# Patient Record
Sex: Male | Born: 1970 | Race: Black or African American | Hispanic: No | Marital: Single | Smoking: Former smoker
Health system: Southern US, Community
[De-identification: ages and names within clinical notes are randomized; demographics above are authoritative.]

---

## 1999-01-19 ENCOUNTER — Emergency Department (HOSPITAL_COMMUNITY): Admission: EM | Admit: 1999-01-19 | Discharge: 1999-01-19 | Payer: Self-pay | Admitting: Emergency Medicine

## 2000-12-15 ENCOUNTER — Emergency Department (HOSPITAL_COMMUNITY): Admission: EM | Admit: 2000-12-15 | Discharge: 2000-12-15 | Payer: Self-pay | Admitting: Emergency Medicine

## 2000-12-17 ENCOUNTER — Emergency Department (HOSPITAL_COMMUNITY): Admission: EM | Admit: 2000-12-17 | Discharge: 2000-12-17 | Payer: Self-pay | Admitting: Emergency Medicine

## 2000-12-18 ENCOUNTER — Emergency Department (HOSPITAL_COMMUNITY): Admission: EM | Admit: 2000-12-18 | Discharge: 2000-12-18 | Payer: Self-pay | Admitting: Emergency Medicine

## 2000-12-24 ENCOUNTER — Encounter: Admission: RE | Admit: 2000-12-24 | Discharge: 2000-12-24 | Payer: Self-pay | Admitting: Internal Medicine

## 2000-12-29 ENCOUNTER — Encounter: Admission: RE | Admit: 2000-12-29 | Discharge: 2000-12-29 | Payer: Self-pay | Admitting: Internal Medicine

## 2001-05-21 ENCOUNTER — Encounter: Admission: RE | Admit: 2001-05-21 | Discharge: 2001-05-21 | Payer: Self-pay

## 2001-06-30 ENCOUNTER — Encounter: Admission: RE | Admit: 2001-06-30 | Discharge: 2001-06-30 | Payer: Self-pay | Admitting: Internal Medicine

## 2001-09-10 ENCOUNTER — Encounter: Admission: RE | Admit: 2001-09-10 | Discharge: 2001-09-10 | Payer: Self-pay | Admitting: Internal Medicine

## 2002-02-05 ENCOUNTER — Encounter: Admission: RE | Admit: 2002-02-05 | Discharge: 2002-02-05 | Payer: Self-pay | Admitting: Internal Medicine

## 2002-03-29 ENCOUNTER — Ambulatory Visit (HOSPITAL_COMMUNITY): Admission: RE | Admit: 2002-03-29 | Discharge: 2002-03-29 | Payer: Self-pay | Admitting: Internal Medicine

## 2002-03-29 ENCOUNTER — Encounter: Admission: RE | Admit: 2002-03-29 | Discharge: 2002-03-29 | Payer: Self-pay | Admitting: Internal Medicine

## 2002-03-29 ENCOUNTER — Encounter: Payer: Self-pay | Admitting: Internal Medicine

## 2002-04-19 ENCOUNTER — Encounter: Admission: RE | Admit: 2002-04-19 | Discharge: 2002-04-19 | Payer: Self-pay | Admitting: Internal Medicine

## 2002-05-04 ENCOUNTER — Encounter: Admission: RE | Admit: 2002-05-04 | Discharge: 2002-05-04 | Payer: Self-pay | Admitting: Internal Medicine

## 2002-05-12 ENCOUNTER — Emergency Department (HOSPITAL_COMMUNITY): Admission: EM | Admit: 2002-05-12 | Discharge: 2002-05-12 | Payer: Self-pay | Admitting: *Deleted

## 2002-05-14 ENCOUNTER — Encounter: Admission: RE | Admit: 2002-05-14 | Discharge: 2002-05-14 | Payer: Self-pay | Admitting: Internal Medicine

## 2003-01-08 ENCOUNTER — Emergency Department (HOSPITAL_COMMUNITY): Admission: EM | Admit: 2003-01-08 | Discharge: 2003-01-08 | Payer: Self-pay | Admitting: Emergency Medicine

## 2003-09-30 ENCOUNTER — Encounter: Admission: RE | Admit: 2003-09-30 | Discharge: 2003-09-30 | Payer: Self-pay | Admitting: Internal Medicine

## 2003-12-27 ENCOUNTER — Emergency Department (HOSPITAL_COMMUNITY): Admission: AC | Admit: 2003-12-27 | Discharge: 2003-12-27 | Payer: Self-pay

## 2004-01-02 ENCOUNTER — Emergency Department (HOSPITAL_COMMUNITY): Admission: EM | Admit: 2004-01-02 | Discharge: 2004-01-02 | Payer: Self-pay | Admitting: *Deleted

## 2004-01-14 ENCOUNTER — Emergency Department (HOSPITAL_COMMUNITY): Admission: EM | Admit: 2004-01-14 | Discharge: 2004-01-14 | Payer: Self-pay | Admitting: Emergency Medicine

## 2004-06-28 ENCOUNTER — Emergency Department (HOSPITAL_COMMUNITY): Admission: EM | Admit: 2004-06-28 | Discharge: 2004-06-28 | Payer: Self-pay | Admitting: Family Medicine

## 2004-06-30 ENCOUNTER — Emergency Department (HOSPITAL_COMMUNITY): Admission: EM | Admit: 2004-06-30 | Discharge: 2004-06-30 | Payer: Self-pay | Admitting: Emergency Medicine

## 2006-06-14 ENCOUNTER — Emergency Department (HOSPITAL_COMMUNITY): Admission: EM | Admit: 2006-06-14 | Discharge: 2006-06-14 | Payer: Self-pay | Admitting: Emergency Medicine

## 2008-11-05 ENCOUNTER — Emergency Department (HOSPITAL_COMMUNITY): Admission: EM | Admit: 2008-11-05 | Discharge: 2008-11-05 | Payer: Self-pay | Admitting: Emergency Medicine

## 2009-06-14 ENCOUNTER — Emergency Department (HOSPITAL_COMMUNITY): Admission: EM | Admit: 2009-06-14 | Discharge: 2009-06-14 | Payer: Self-pay | Admitting: Family Medicine

## 2009-11-15 ENCOUNTER — Emergency Department (HOSPITAL_COMMUNITY): Admission: EM | Admit: 2009-11-15 | Discharge: 2009-11-15 | Payer: Self-pay | Admitting: Family Medicine

## 2010-04-04 ENCOUNTER — Emergency Department (HOSPITAL_COMMUNITY)
Admission: EM | Admit: 2010-04-04 | Discharge: 2010-04-04 | Payer: Self-pay | Source: Home / Self Care | Admitting: Family Medicine

## 2010-05-21 ENCOUNTER — Emergency Department (HOSPITAL_COMMUNITY)
Admission: EM | Admit: 2010-05-21 | Discharge: 2010-05-21 | Payer: Self-pay | Source: Home / Self Care | Admitting: Emergency Medicine

## 2010-07-11 LAB — URINE CULTURE
Colony Count: NO GROWTH
Culture: NO GROWTH

## 2010-07-11 LAB — POCT URINALYSIS DIP (DEVICE)
Glucose, UA: NEGATIVE mg/dL
Nitrite: NEGATIVE
Protein, ur: NEGATIVE mg/dL
Specific Gravity, Urine: 1.015 (ref 1.005–1.030)
Urobilinogen, UA: 0.2 mg/dL (ref 0.0–1.0)

## 2010-07-11 LAB — GC/CHLAMYDIA PROBE AMP, GENITAL
Chlamydia, DNA Probe: NEGATIVE
GC Probe Amp, Genital: NEGATIVE

## 2012-08-20 ENCOUNTER — Emergency Department (INDEPENDENT_AMBULATORY_CARE_PROVIDER_SITE_OTHER)
Admission: EM | Admit: 2012-08-20 | Discharge: 2012-08-20 | Disposition: A | Discharge status: Home / Self Care | Payer: Self-pay | Source: Home / Self Care

## 2012-08-20 ENCOUNTER — Encounter (HOSPITAL_COMMUNITY): Payer: Self-pay | Admitting: *Deleted

## 2012-08-20 DIAGNOSIS — J309 Allergic rhinitis, unspecified: Secondary | ICD-10-CM

## 2012-08-20 DIAGNOSIS — J302 Other seasonal allergic rhinitis: Secondary | ICD-10-CM

## 2012-08-20 NOTE — ED Provider Notes (Signed)
History     CSN: [id number]  Arrival date & time 08/20/12  7829   None     Chief Complaint  Patient presents with  . Sore Throat    (Consider location/radiation/quality/duration/timing/severity/associated sxs/prior treatment) Patient is a 42 y.o. male presenting with pharyngitis. The history is provided by the patient.  Sore Throat This is a new problem. The current episode started 6 to 12 hours ago. The problem occurs every several days. The problem has been gradually worsening (rides motorcycle with mask up , slept with window open.). The symptoms are aggravated by swallowing.    History reviewed. No pertinent past medical history.  History reviewed. No pertinent past surgical history.  No family history on file.  History  Substance Use Topics  . Smoking status: Never Smoker   . Smokeless tobacco: Not on file  . Alcohol Use: Yes      Review of Systems  Constitutional: Negative.   HENT: Positive for congestion and sore throat.     Allergies  Review of patient's allergies indicates no known allergies.  Home Medications  No current outpatient prescriptions on file.  BP 135/71  Pulse 70  Temp(Src) 98 F (36.7 C) (Oral)  Resp 16  SpO2 100%  Physical Exam  Nursing note and vitals reviewed. Constitutional: He appears well-developed and well-nourished.  HENT:  Head: Normocephalic.  Right Ear: External ear normal.  Left Ear: External ear normal.  Mouth/Throat: Oropharynx is clear and moist.  Neck: Normal range of motion. Neck supple.  Neurological: He is alert.  Skin: Skin is warm and dry.    ED Course  Procedures (including critical care time)  Labs Reviewed  POCT RAPID STREP A ([facility] URG CARE ONLY)   No results found.   1. Seasonal allergic reaction       MDM          Linna Hoff, MD 08/23/12 [id number]

## 2012-08-20 NOTE — ED Notes (Signed)
Pt  Reports  sorethroat  With  Pain  r  Gland  Area    Radiating  To  r  Ear       Pt  Reports   It  Is  painfull  When  He  Swallows     Symptoms  X  1  Day

## 2013-06-08 ENCOUNTER — Encounter (HOSPITAL_COMMUNITY): Payer: Self-pay | Admitting: Emergency Medicine

## 2013-06-08 ENCOUNTER — Emergency Department (INDEPENDENT_AMBULATORY_CARE_PROVIDER_SITE_OTHER)
Admission: EM | Admit: 2013-06-08 | Discharge: 2013-06-08 | Disposition: A | Discharge status: Home / Self Care | Payer: BC Managed Care – PPO | Source: Home / Self Care

## 2013-06-08 DIAGNOSIS — IMO0002 Reserved for concepts with insufficient information to code with codable children: Secondary | ICD-10-CM

## 2013-06-08 DIAGNOSIS — S86919A Strain of unspecified muscle(s) and tendon(s) at lower leg level, unspecified leg, initial encounter: Secondary | ICD-10-CM

## 2013-06-08 NOTE — ED Provider Notes (Signed)
CSN: [id number]     Arrival date & time 06/08/13  1346 History   First MD Initiated Contact with Patient 06/08/13 1441     Chief Complaint  Patient presents with  . Leg Injury     (Consider location/radiation/quality/duration/timing/severity/associated sxs/prior Treatment) HPI Comments: As above, c/o pain in the right calf muscle, getting tight and tender. Can bear full wt. Wife st the pt was told to get his leg checked by his employer.   History reviewed. No pertinent past medical history. History reviewed. No pertinent past surgical history. History reviewed. No pertinent family history. History  Substance Use Topics  . Smoking status: Never Smoker   . Smokeless tobacco: Not on file  . Alcohol Use: Yes    Review of Systems  Constitutional: Negative.   Respiratory: Negative.   Gastrointestinal: Negative.   Genitourinary: Negative.   Musculoskeletal: Negative for joint swelling.       As per HPI  Skin: Negative.   Neurological: Negative for dizziness, weakness, numbness and headaches.      Allergies  Review of patient's allergies indicates no known allergies.  Home Medications  No current outpatient prescriptions on file. BP 120/70  Pulse 72  Temp(Src) 98.6 F (37 C) (Oral)  Resp 16  SpO2 100% Physical Exam  Nursing note and vitals reviewed. Constitutional: He is oriented to person, place, and time. He appears well-developed and well-nourished. No distress.  HENT:  Head: Normocephalic and atraumatic.  Eyes: EOM are normal. Left eye exhibits no discharge.  Neck: Normal range of motion. Neck supple.  Pulmonary/Chest: Effort normal. No respiratory distress.  Musculoskeletal: Normal range of motion. He exhibits no edema.  Mild tenderness to the R calf muscle. No swelling.  Pedal pulse 2+ , distal N/V and M/S intact.  Neurological: He is alert and oriented to person, place, and time. No cranial nerve deficit.  Skin: Skin is warm and dry.  Psychiatric: He has a  normal mood and affect.    ED Course  Procedures (including critical care time) Labs Review Labs Reviewed - No data to display Imaging Review No results found.    MDM   Final diagnoses:  Muscle strain of lower leg   Calf muscle strain Had ordered an Xray of the tib fib, primarily to visualize the fibula. The pt and sig other refused to have the x ray.  D/C with dx of muscle strain of calf, Heat. If worse or new problems recheck. Advised unable to be certain no fx and may get worse.    Hayden Rasmussenavid Casara Perrier, NP 06/08/13 1515

## 2013-06-08 NOTE — Discharge Instructions (Signed)

## 2013-06-08 NOTE — ED Notes (Signed)
Pt  Reports  Last  Pm     He  Slipped on ice    While  Walking  Up       Stairs          He  Has  Pain in the  Calf        He is  Able  To  Ambulate  On the  Right  Affected  Leg        With pain on weight bearing

## 2013-06-08 NOTE — ED Notes (Signed)
Pt  Refuses  X  Ray   Mark Huaavid  Singleton  Notified

## 2013-06-09 NOTE — ED Provider Notes (Signed)
Medical screening examination/treatment/procedure(s) were performed by resident physician or non-physician practitioner and as supervising physician I was immediately available for consultation/collaboration.   Angelamarie Avakian DOUGLAS MD.   Cornisha Zetino D Pierra Skora, MD 06/09/13 2004 

## 2013-12-24 ENCOUNTER — Emergency Department (HOSPITAL_COMMUNITY)
Admission: EM | Admit: 2013-12-24 | Discharge: 2013-12-24 | Disposition: A | Discharge status: Home / Self Care | Payer: BC Managed Care – PPO | Attending: Emergency Medicine | Admitting: Emergency Medicine

## 2013-12-24 ENCOUNTER — Encounter (HOSPITAL_COMMUNITY): Payer: Self-pay | Admitting: Emergency Medicine

## 2013-12-24 ENCOUNTER — Emergency Department (HOSPITAL_COMMUNITY): Payer: BC Managed Care – PPO

## 2013-12-24 DIAGNOSIS — F17200 Nicotine dependence, unspecified, uncomplicated: Secondary | ICD-10-CM | POA: Insufficient documentation

## 2013-12-24 DIAGNOSIS — R5382 Chronic fatigue, unspecified: Secondary | ICD-10-CM

## 2013-12-24 DIAGNOSIS — G9332 Myalgic encephalomyelitis/chronic fatigue syndrome: Secondary | ICD-10-CM | POA: Insufficient documentation

## 2013-12-24 DIAGNOSIS — R197 Diarrhea, unspecified: Secondary | ICD-10-CM | POA: Insufficient documentation

## 2013-12-24 DIAGNOSIS — R0602 Shortness of breath: Secondary | ICD-10-CM | POA: Insufficient documentation

## 2013-12-24 LAB — CBC WITH DIFFERENTIAL/PLATELET
BASOS ABS: 0 10*3/uL (ref 0.0–0.1)
BASOS PCT: 1 % (ref 0–1)
EOS PCT: 2 % (ref 0–5)
Eosinophils Absolute: 0.1 10*3/uL (ref 0.0–0.7)
HCT: 39.5 % (ref 39.0–52.0)
HEMOGLOBIN: 13.6 g/dL (ref 13.0–17.0)
LYMPHS ABS: 1.6 10*3/uL (ref 0.7–4.0)
Lymphocytes Relative: 35 % (ref 12–46)
MCH: 28.5 pg (ref 26.0–34.0)
MCHC: 34.4 g/dL (ref 30.0–36.0)
MCV: 82.8 fL (ref 78.0–100.0)
MONO ABS: 0.5 10*3/uL (ref 0.1–1.0)
MONOS PCT: 11 % (ref 3–12)
NEUTROS ABS: 2.3 10*3/uL (ref 1.7–7.7)
NEUTROS PCT: 51 % (ref 43–77)
PLATELETS: 181 10*3/uL (ref 150–400)
RBC: 4.77 MIL/uL (ref 4.22–5.81)
RDW: 13.3 % (ref 11.5–15.5)
WBC: 4.4 10*3/uL (ref 4.0–10.5)

## 2013-12-24 LAB — COMPREHENSIVE METABOLIC PANEL
ALT: 17 U/L (ref 0–53)
ANION GAP: 13 (ref 5–15)
AST: 18 U/L (ref 0–37)
Albumin: 4 g/dL (ref 3.5–5.2)
Alkaline Phosphatase: 51 U/L (ref 39–117)
BUN: 12 mg/dL (ref 6–23)
CHLORIDE: 105 meq/L (ref 96–112)
CO2: 24 mEq/L (ref 19–32)
Calcium: 8.9 mg/dL (ref 8.4–10.5)
Creatinine, Ser: 0.87 mg/dL (ref 0.50–1.35)
GFR calc Af Amer: >90 mL/min (ref >90)
GLUCOSE: 128 mg/dL — AB (ref 70–99)
POTASSIUM: 3.5 meq/L — AB (ref 3.7–5.3)
Sodium: 142 mEq/L (ref 137–147)
TOTAL PROTEIN: 6.8 g/dL (ref 6.0–8.3)

## 2013-12-24 LAB — URINALYSIS, ROUTINE W REFLEX MICROSCOPIC
Bilirubin Urine: NEGATIVE
Glucose, UA: NEGATIVE mg/dL
HGB URINE DIPSTICK: NEGATIVE
Ketones, ur: NEGATIVE mg/dL
Leukocytes, UA: NEGATIVE
Nitrite: NEGATIVE
PH: 7 (ref 5.0–8.0)
PROTEIN: NEGATIVE mg/dL
Specific Gravity, Urine: 1.024 (ref 1.005–1.030)
Urobilinogen, UA: 0.2 mg/dL (ref 0.0–1.0)

## 2013-12-24 LAB — I-STAT TROPONIN, ED: Troponin i, poc: 0 ng/mL (ref 0.00–0.08)

## 2013-12-24 LAB — TSH: TSH: 1.24 u[IU]/mL (ref 0.350–4.500)

## 2013-12-24 NOTE — Discharge Instructions (Signed)
Continue to stay hydrated. Follow up with your doctor for further evaluation if symptoms continue   Chronic Diarrhea Diarrhea is frequent loose and watery bowel movements. It can cause you to feel weak and dehydrated. Dehydration can cause you to become tired and thirsty and to have a dry mouth, decreased urination, and dark yellow urine. Diarrhea is a sign of another problem, most often an infection that will not last long. In most cases, diarrhea lasts 2-3 days. Diarrhea that lasts longer than 4 weeks is called long-lasting (chronic) diarrhea. It is important to treat your diarrhea as directed by your health care provider to lessen or prevent future episodes of diarrhea.  CAUSES  There are many causes of chronic diarrhea. The following are some possible causes:   Gastrointestinal infections caused by viruses, bacteria, or parasites.   Food poisoning or food allergies.   Certain medicines, such as antibiotics, chemotherapy, and laxatives.   Artificial sweeteners and fructose.   Digestive disorders, such as celiac disease and inflammatory bowel diseases.   Irritable bowel syndrome.  Some disorders of the pancreas.  Disorders of the thyroid.  Reduced blood flow to the intestines.  Cancer. Sometimes the cause of chronic diarrhea is unknown. RISK FACTORS  Having a severely weakened immune system, such as from HIV or AIDS.   Taking certain types of cancer-fighting drugs (such as with chemotherapy) or other medicines.   Having had a recent organ transplant.   Having a portion of the stomach or small bowel removed.   Traveling to countries where food and water supplies are often contaminated.  SYMPTOMS  In addition to frequent, loose stools, diarrhea may cause:   Cramping.   Abdominal pain.   Nausea.   Fever.  Fatigue.  Urgent need to use the bathroom.  Loss of bowel control. DIAGNOSIS  Your health care provider must take a careful history and perform a  physical exam. Tests given are based on your symptoms and history. Tests may include:   Blood or stool tests. Three or more stool samples may be examined. Stool cultures may be used to test for bacteria or parasites.   X-rays.   A procedure in which a thin tube is inserted into the mouth or rectum (endoscopy). This allows the health care provider to look inside the intestine.  TREATMENT   Treatment is aimed at correcting the cause of the diarrhea when possible.  Diarrhea caused by an infection can often be treated with antibiotic medicines.  Diarrhea not caused by an infection may require you to take long-term medicine or have surgery. Specific treatment should be discussed with your health care provider.  If the cause cannot be determined, treatment aims to relieve symptoms and prevent dehydration. Serious health problems can occur if you do not maintain proper fluid levels. Treatment may include:  Taking an oral rehydration solution (ORS).  Not drinking beverages that contain caffeine (such as tea, coffee, and soft drinks).  Not drinking alcohol.  Maintaining well-balanced nutrition to help you recover faster. HOME CARE INSTRUCTIONS   Drink enough fluids to keep urine clear or pale yellow. Drink 1 cup (8 oz) of fluid for each diarrhea episode. Avoid fluids that contain simple sugars, fruit juices, whole milk products, and sodas. Hydrate with an ORS. You may purchase the ORS or prepare it at home by mixing the following ingredients together:   - tsp (1.7-3  mL) table salt.   tsp (3  mL) baking soda.   tsp (1.7 mL) salt substitute containing  potassium chloride.  1 tbsp (20 mL) sugar.  4.2 c (1 L) of water.   Certain foods and beverages may increase the speed at which food moves through the gastrointestinal (GI) tract. These foods and beverages should be avoided. They include:  Caffeinated and alcoholic beverages.  High-fiber foods, such as raw fruits and vegetables,  nuts, seeds, and whole grain breads and cereals.  Foods and beverages sweetened with sugar alcohols, such as xylitol, sorbitol, and mannitol.   Some foods may be well tolerated and may help thicken stool. These include:  Starchy foods, such as rice, toast, pasta, low-sugar cereal, oatmeal, grits, baked potatoes, crackers, and bagels.  Bananas.  Applesauce.  Add probiotic-rich foods to help increase healthy bacteria in the GI tract. These include yogurt and fermented milk products.  Wash your hands well after each diarrhea episode.  Only take over-the-counter or prescription medicines as directed by your health care provider.  Take a warm bath to relieve any burning or pain from frequent diarrhea episodes. SEEK MEDICAL CARE IF:   You are not urinating as often.  Your urine is a dark color.  You become very tired or dizzy.  You have severe pain in the abdomen or rectum.  Your have blood or pus in your stools.  Your stools look black and tarry. SEEK IMMEDIATE MEDICAL CARE IF:   You are unable to keep fluids down.  You have persistent vomiting.  You have blood in your stool.  Your stools are black and tarry.  You do not urinate in 6-8 hours, or there is only a small amount of very dark urine.  You have abdominal pain that increases or localizes.  You have weakness, dizziness, confusion, or lightheadedness.  You have a severe headache.  Your diarrhea gets worse or does not get better.  You have a fever or persistent symptoms for more than 2-3 days.  You have a fever and your symptoms suddenly get worse. MAKE SURE YOU:   Understand these instructions.  Will watch your condition.  Will get help right away if you are not doing well or get worse. Document Released: 06/29/2003 Document Revised: 04/13/2013 Document Reviewed: 10/01/2012 [facility] Patient Information 2015 [place], [place]. This information is not intended to replace advice given to you by your health  care provider. Make sure you discuss any questions you have with your health care provider.  Emergency Department Resource Guide 1) Find a Doctor and Pay Out of Pocket Although you won't have to find out who is covered by your insurance plan, it is a good idea to ask around and get recommendations. You will then need to call the office and see if the doctor you have chosen will accept you as a new patient and what types of options they offer for patients who are self-pay. Some doctors offer discounts or will set up payment plans for their patients who do not have insurance, but you will need to ask so you aren't surprised when you get to your appointment.  2) Contact Your Local Health Department Not all health departments have doctors that can see patients for sick visits, but many do, so it is worth a call to see if yours does. If you don't know where your local health department is, you can check in your phone book. The CDC also has a tool to help you locate your state's health department, and many state websites also have listings of all of their local health departments.  3) Find a Walk-in  Clinic If your illness is not likely to be very severe or complicated, you may want to try a walk in clinic. These are popping up all over the country in pharmacies, drugstores, and shopping centers. They're usually staffed by nurse practitioners or physician assistants that have been trained to treat common illnesses and complaints. They're usually fairly quick and inexpensive. However, if you have serious medical issues or chronic medical problems, these are probably not your best option.  No Primary Care Doctor: - Call Health Connect at  [phone] - they can help you locate a primary care doctor that  accepts your insurance, provides certain services, etc. - Physician Referral Service- [phone]  Chronic Pain Problems: Organization         Address  Phone   Notes  [facility]  [phone] Patients need to be referred by their primary care doctor.   Medication Assistance: Organization         Address  Phone   Notes  [facility] [address] [place] [place], [place] [address] [phone] --Must be a resident of [facility] -- Must have NO insurance coverage whatsoever (no Medicaid/ Medicare, etc.) -- The pt. MUST have a primary care doctor that directs their care regularly and follows them in the community   [facility]  [phone]   [employer]  [phone]    Agencies that provide inexpensive medical care: Organization         Address  Phone   Notes  [facility]  [phone]   [facility]    [phone]   [facility] [address] [place], [place] [address] [phone]   [facility] [address] [place] [place] [phone]   [facility]anned Parenthood    [phone]   [facility]    [phone]   [facility]  [address], [place] Phone:  [phone], Fax:  [phone] Hours of Operation:  9 am - 6 pm, M-F.  Also accepts Medicaid/Medicare and self-pay.  [facility]  [address], [place] Phone: [phone], Fax: [phone]. Hours of Operation:  8:30 am - 5:30 pm, M-F.  Also accepts Medicaid and self-pay.  [facility] [place] [address] [place] Phone: [phone]   [facility] [address] Natasha Bence [place], [place] [phone], Ext. [phone] Mondays & Thursdays: 7-9 AM.  First 15 patients are seen on a first come, first serve basis.    Medicaid-accepting [facility] Providers:  Organization         Address  Phone   Notes  [facility] [address], [place] [phone] Also accepts self-pay patients.  [facility] [address] Laurell Josephs [place], [place]   [phone]   [facility] [address], [place] [phone]   [facility] [address] [place] [phone]   Renaye Rakers [address], [place]   [phone] Only accepts [place] Access [place] patients after they have their name applied to their card.   Self-Pay (no insurance) in [facility]:  Organization         Address  Phone   Notes  Sickle Cell Patients, Engineer, technical sales Internal Medicine [address] [place], [place] (  [phone]   [facility] [address] [place], [place] [phone]   [facility] [place]  [address], [place] [phone]   [facility]/Dr. Osei-Bonsu  [address], [place] or [address], [place] [phone] Phone number for both [place] and [place] locations is the same.  Urgent Medical and [facility] [address], [place] [phone]   [facility] [address] [place] or [address] [phone]   [facility] [address], [place] [phone], phone; [phone], fax Sees patients 1st and 3rd Saturday of every month.  Must not qualify for public or private insurance (i.e. Medicaid, Medicare, [place] Health Choice, Veterans' Benefits)  Household income should be no more than 200% of the poverty level The clinic cannot treat you if you are pregnant or think you are pregnant  Sexually transmitted diseases are not treated at the clinic.    Dental Care: Organization         Address  Phone  Notes  [facility] Department of [facility] [address] [place], [place] [phone] Accepts children up to age 90 who are enrolled in [place] or [employer] Health Choice; pregnant women with a Medicaid card; and children who have applied for Medicaid or [employer] Health Choice, but  were declined, whose parents can pay a reduced fee at time of service.  [facility] Department of [facility]  [address], [place] [phone] Accepts children up to age 2 who are enrolled in [place] or  Health Choice; pregnant women with a Medicaid card; and children who have applied for Medicaid or  Health Choice, but were declined, whose parents can pay a reduced fee at time of service.  [facility]  [address] [place], [place] [phone] Patients are seen by appointment only. Walk-ins are not accepted. [facility] will see patients 74 years of age and older. Monday - Tuesday (8am-5pm) Most Wednesdays (8:30-5pm) $30 per visit, cash only  [facility] Adult Dental Access PRO[facility]  [address], [facility] [phone] Patients are seen by appointment only. Walk-ins are not accepted. [facility] will see patients 8 years of age and older. One Wednesday Evening (Monthly: Volunteer Based).  $30 per visit, cash only  [employer]  [phone] for adults; Children under age 24, call Graduate Pediatric Dentistry at [phone]. Children aged 24-14, please call [phone] to request a pediatric application.  Dental services are provided in all areas of dental care including fillings, crowns and bridges, complete and partial dentures, implants, gum treatment, root canals, and extractions. Preventive care is also provided. Treatment is provided to both adults and children. Patients are selected via a lottery and there is often a waiting list.   [facility] [address] [place]  [phone] [email or web address]   [facility] [address] [place], [place] [phone], Ext. [phone] Second and Fourth Thursday of each month, opens at 6:30 AM; Clinic ends at 9 AM.  Patients are seen on a first-come first-served basis, and a limited number are seen during each clinic.    [facility]  [address] Ether Griffins [place], [place] [phone]   Eligibility Requirements You must have lived in [place], [place], or [place] counties  for at least the last three months.   You cannot be eligible for state or federal sponsored [place], including [employer], [place], or [employer].   You generally cannot be eligible for healthcare insurance through your employer.    How to apply: Eligibility screenings are held every Tuesday and Wednesday afternoon from 1:00 pm until 4:00 pm. You do not need an appointment for the interview!  [facility] [address], [place], [place] [phone]   [facility] Health Department  [phone]   [facility] Health Department  [phone]   [facility] Health Department  [phone]    Behavioral Health Resources in the Community: Intensive Outpatient Programs Organization         Address  Phone  Notes  [facility] [address] [place], [place] [phone]   [facility] [address] [place], [place] [phone]   [facility] [address] [place], [place]  [phone]   [facility] [address],  [place], [place] [phone] or [phone]   Substance Abuse Resources Organization         Address  Phone  Notes  [facility] Drug Services  [phone]   [facility]  [phone]   [facility] [place]  [phone]   Floydene Flock  [phone]   Residential & Outpatient Substance Abuse Program  [phone]   Psychological Services Organization         Address  Phone  Notes  [facility]  [phone]   [facility] Services  [phone]   [facility] [address], [place] [phone] or [phone]    Mobile Crisis Teams Organization         Address  Phone  Notes  [facility], Mobile Crisis Care  Unit  [phone]   [facility]  [address] [place], [place] [phone]   Doristine Locks [address] [place] [phone]    Self-Help/Support Groups Organization         Address  Phone             Notes  [facility] [place] - variety of support groups  336- I7437963 Call for more information  Narcotics Anonymous (NA), [facility] [address], [employer] [place]  2 meetings at this location   Statistician         Address  Phone  Notes  [facility] [address],    [place]  [phone]   [facility]  [address], [place] [address], [place], [place] [phone]   [facility] Treatment Facility [address] [place], [place] [phone] Admissions: 8am-3pm M-F  [facility] [address]    [place], [place] [phone]   [facility] [address] Starling Manns [place], [place] [phone]   [facility] [address],  [place], [place] [phone]   [facility] - Intensive Outpatient [address] Laure[address] Joseph[address], [place], [place] [phone]   [facility] (Addiction [facility] [address] [place]  [place], [place] [phone] or [phone]   [facility] ([facility]) [address], [place], [place] [phone] Accepts Medicaid  [facility] [address]  [place] [phone] Substance Abuse/Addiction Treatment   [facility] Resources Organization         Address  Phone  Notes  [facility]  [phone]  [phone]   Angie Fava, PhD [address] Ervin Knack [place], [place]   [phone] or ([phone])[phone]   [facility] Behavioral   [address] [place], [place] [phone]   [facility] [address] [place], [place] [phone] Insurance/Medicaid/sponsorship through [facility] and Families [address]                                     [place], [place] [phone] Therapy/tele-psych/case  [facility] [address] [place] [address], [place] [phone]    Dr. Lolly Mustache  [phone]   [facility] [place]  [employer] [facility] Dept. 1) [address]  2) [address], [place] 3)  [address], [place] [phone]  [phone]   [facility] Child Abuse Hotline 843-194-3959 or [phone] (After Hours)

## 2013-12-24 NOTE — ED Provider Notes (Signed)
CSN: [id number]     Arrival date & time 12/24/13  4098 History   First MD Initiated Contact with Patient 12/24/13 1027     Chief Complaint  Patient presents with  . Shortness of Breath  . Diarrhea  . Fatigue     (Consider location/radiation/quality/duration/timing/severity/associated sxs/prior Treatment) HPI Mark Singleton is a 43 y.o. male who presents to emergency department with multiple complaints. Patient denies any medical problems. He states the last few years he has had increased weakness, shortness of breath especially when he is laying down, fatigue, diarrhea daily. States he goes his symptoms and they are consistent with a thyroid disease. He denies taking any medications for this. He denies any pain anywhere. He denies chest pain, abdominal pain, back pain, headache, neck pain or stiffness. He denies any drugs. He denies any fever chills. His primary care Dr. this time. He states "I just came here to get some blood work checked, I haven't had it in a while."  History reviewed. No pertinent past medical history. History reviewed. No pertinent past surgical history. No family history on file. History  Substance Use Topics  . Smoking status: Current Some Day Smoker    Types: Cigarettes  . Smokeless tobacco: Not on file  . Alcohol Use: Yes     Comment: occ    Review of Systems  Constitutional: Positive for fatigue. Negative for fever and chills.  Respiratory: Negative for cough, chest tightness and shortness of breath.   Cardiovascular: Negative for chest pain, palpitations and leg swelling.  Gastrointestinal: Positive for diarrhea. Negative for nausea, vomiting, abdominal pain and abdominal distention.  Genitourinary: Negative for dysuria, urgency, frequency and hematuria.  Musculoskeletal: Negative for arthralgias, myalgias, neck pain and neck stiffness.  Skin: Negative for rash.  Allergic/Immunologic: Negative for immunocompromised state.  Neurological: Positive for  weakness. Negative for dizziness, light-headedness, numbness and headaches.      Allergies  Review of patient's allergies indicates no known allergies.  Home Medications   Prior to Admission medications   Not on File   BP 138/94  Pulse 89  Temp(Src) 98.2 F (36.8 C) (Oral)  Resp 17  Ht  (1.702 m)  Wt 158 lb (71.668 kg)  BMI 24.74 kg/m2  SpO2 100% Physical Exam  Nursing note and vitals reviewed. Constitutional: He appears well-developed and well-nourished. No distress.  HENT:  Head: Normocephalic and atraumatic.  Eyes: Conjunctivae are normal.  Neck: Neck supple.  Cardiovascular: Normal rate, regular rhythm and normal heart sounds.   Pulmonary/Chest: Effort normal. No respiratory distress. He has no wheezes. He has no rales.  Abdominal: Soft. Bowel sounds are normal. He exhibits no distension. There is no tenderness. There is no rebound and no guarding.  Musculoskeletal: He exhibits no edema.  Neurological: He is alert.  Skin: Skin is warm and dry.    ED Course  Procedures (including critical care time) Labs Review Labs Reviewed  COMPREHENSIVE METABOLIC PANEL - Abnormal; Notable for the following:    Potassium 3.5 (*)    Glucose, Bld 128 (*)    Total Bilirubin <0.2 (*)    All other components within normal limits  CBC WITH DIFFERENTIAL  URINALYSIS, ROUTINE W REFLEX MICROSCOPIC  TSH  I-STAT TROPOININ, ED    Imaging Review Dg Chest 2 View  12/24/2013   CLINICAL DATA:  Shortness of breath, diarrhea, fatigue  EXAM: CHEST  2 VIEW  COMPARISON:  12/27/2003  FINDINGS: Normal heart size, mediastinal contours, and pulmonary vascularity.  Lungs clear.  No pneumothorax.  Bones unremarkable.  IMPRESSION: Normal exam.   Electronically Signed   By: Ulyses Southward M.D.   On: 12/24/2013 12:16    Date: 12/24/2013  Rate: 79  Rhythm: normal sinus rhythm  QRS Axis: normal  Intervals: normal  ST/T Wave abnormalities: ST depressions inferiorly  Conduction Disutrbances:none   Narrative Interpretation:   Old EKG Reviewed: none available    MDM   Final diagnoses:  Chronic fatigue  Diarrhea   Patient is here with symptoms that have been going on for 2 years. He stated that he just wanted to get checked out. Exam is unremarkable. Vital signs are normal. Labs, including TSH, urinalysis, chest x-ray normal. ECG showing some T wave inversion, however, pt denies ever having chest pain, no exertional symptoms, troponin here is 0. Will need outpatient work up. Stable for discharge home with outpatient primary care provider followup. Instructed to return if his symptoms are worsening.  Filed Vitals:   12/24/13 1245 12/24/13 1300 12/24/13 1330 12/24/13 1350  BP: 131/98 131/90 132/93 132/93  Pulse: 55 53 59 64  Temp:      TempSrc:      Resp:    20  Height:      Weight:      SpO2: 98% 99% 98% 100%         Mehran Guderian A Janett Kamath, PA-C 12/24/13 1603

## 2013-12-24 NOTE — ED Notes (Signed)
Patient transported to X-ray 

## 2013-12-24 NOTE — ED Notes (Signed)
Pt states for the last 4 or 5 months he has been experiencing sob, diarrhea and fatigue.  Denies pain.  He looked online and the symptoms pointed to his thyroid.  He came in today because tired of worrying about it.

## 2013-12-24 NOTE — ED Provider Notes (Signed)
Medical screening examination/treatment/procedure(s) were performed by non-physician practitioner and as supervising physician I was immediately available for consultation/collaboration.   EKG Interpretation None       Calum Cormier, MD 12/24/13 1816 

## 2013-12-31 NOTE — Discharge Planning (Signed)
[facility] Liaison was not able to see patient, [facility] orange card information and resources will be sent to the address provided.

## 2014-01-21 ENCOUNTER — Encounter (HOSPITAL_COMMUNITY): Payer: Self-pay | Admitting: Emergency Medicine

## 2014-01-21 ENCOUNTER — Emergency Department (INDEPENDENT_AMBULATORY_CARE_PROVIDER_SITE_OTHER)
Admission: EM | Admit: 2014-01-21 | Discharge: 2014-01-21 | Disposition: A | Discharge status: Home / Self Care | Payer: Self-pay | Source: Home / Self Care | Attending: Family Medicine | Admitting: Family Medicine

## 2014-01-21 DIAGNOSIS — R0989 Other specified symptoms and signs involving the circulatory and respiratory systems: Secondary | ICD-10-CM

## 2014-01-21 DIAGNOSIS — F458 Other somatoform disorders: Secondary | ICD-10-CM

## 2014-01-21 DIAGNOSIS — R0982 Postnasal drip: Secondary | ICD-10-CM

## 2014-01-21 MED ORDER — OMEPRAZOLE 40 MG PO CPDR: 40.0000 mg | DELAYED_RELEASE_CAPSULE | Freq: Every day | ORAL | Status: DC

## 2014-01-21 MED ORDER — FLUTICASONE PROPIONATE 50 MCG/ACT NA SUSP: 2.0000 | Freq: Every day | NASAL | Status: DC

## 2014-01-21 NOTE — ED Provider Notes (Signed)
Mark Singleton is a 43 y.o. male who presents to Urgent Care today for something stuck in throat.  Patient over the past several weeks has noted a postnasal drip however this morning he awoke with a feeling as though there is something stuck in his throat. He is able to eat and drink normally and denies any pain or vomiting. No chest pain palpitations or shortness of breath. No known cause. He feels well otherwise. No significant reflux symptoms.   History reviewed. No pertinent past medical history. History  Substance Use Topics  . Smoking status: Current Some Day Smoker    Types: Cigarettes  . Smokeless tobacco: Not on file  . Alcohol Use: Yes     Comment: occ   ROS as above Medications: No current facility-administered medications for this encounter.   Current Outpatient Prescriptions  Medication Sig Dispense Refill  . fluticasone (FLONASE) 50 MCG/ACT nasal spray Place 2 sprays into both nostrils daily.  16 g  2  . omeprazole (PRILOSEC) 40 MG capsule Take 1 capsule (40 mg total) by mouth daily.  30 capsule  1    Exam:  BP 123/88  Pulse 69  Temp(Src) 98.1 F (36.7 C) (Oral)  Resp 18  SpO2 100% Gen: Well NAD nontoxic appearing HEENT: EOMI,  MMM history pharynx cobblestoning. Normal tympanic membranes bilaterally. Lungs: Normal work of breathing. CTABL Heart: RRR no MRG Abd: NABS, Soft. Nondistended, Nontender Exts: Brisk capillary refill, warm and well perfused.   No results found for this or any previous visit (from the past 24 hour(s)). No results found.  Assessment and Plan: 43 y.o. male with globus sensation plus postnasal drip. Plan to treat with omeprazole and Flonase nasal spray. Followup with PCP  Discussed warning signs or symptoms. Please see discharge instructions. Patient expresses understanding.     Rodolph BongEvan S Najee Manninen, MD 01/21/14 1051

## 2014-01-21 NOTE — Discharge Instructions (Signed)
Thank you for coming in today. Use the Flonase nasal spray and for at least one month.  Take omeprazole as well Come back as needed Go to the emergency room if you're unable to swallow.  If your belly pain worsens, or you have high fever, bad vomiting, blood in your stool or black tarry stool go to the Emergency Room.   Globus Syndrome Globus Syndrome is a feeling of a lump or a sensation of something caught in your throat. Eating food or drinking fluids does not seem to get rid of it. Yet it is not noticeable during the actual act of swallowing food or liquids. Usually there is nothing physically wrong. It is troublesome because it is an unpleasant sensation which is sometimes difficult to ignore and at times may seem to worsen. The syndrome is quite common. It is estimated 45% of the population experiences features of the condition at some stage during their lives. The symptoms are usually temporary. The largest group of people who feel the need to seek medical treatment is females between the ages of 6230 to 8360.  CAUSES  Globus Syndrome appears to be triggered by or aggravated by stress, anxiety and depression.  Tension related to stress could product abnormal muscle spasms in the esophagus which would account for the sensation of a lump or ball in your throat.  Frequent swallowing or drying of the throat caused by anxiety or other strong emotions can also produce this uncomfortable sensation in your throat.  Fear and sadness can be expressed by the body in many ways. For instance, if you had a relative with throat cancer you might become overly concerned about your own health and develop uncomfortable sensations in your throat.  The reaction to a crisis or a trauma event in your life can take the form of a lump in your throat. It is as if you are indirectly saying you can not handle or "swallow" one more thing. DIAGNOSIS  Usually your caregiver will know what is wrong by talking to you and  examining you. If the condition persists for several days, more testing may be done to make sure there is not another problem present. This is usually not the case. TREATMENT   Reassurance is often the best treatment available. Usually the problem leaves without treatment over several days.  Sometimes anti-anxiety medications may be prescribed.  Counseling or talk therapy can also help with strong underlying emotions.  Note that in most cases this is not something that keeps coming back and you should not be concerned or worried. Document Released: 06/29/2003 Document Revised: 07/01/2011 Document Reviewed: 11/26/2007 [facility] Patient Information 2015 [place], [place]. This information is not intended to replace advice given to you by your health care provider. Make sure you discuss any questions you have with your health care provider.

## 2014-01-21 NOTE — ED Notes (Signed)
Reports sensation of "something" lodged in back of throat onset this am States he felt mild discomfort last night Hurts to swallow Denies inj/trauma Alert, no signs of acute distress.

## 2014-05-30 ENCOUNTER — Encounter (HOSPITAL_COMMUNITY): Payer: Self-pay

## 2014-05-30 ENCOUNTER — Emergency Department (HOSPITAL_COMMUNITY)
Admission: EM | Admit: 2014-05-30 | Discharge: 2014-05-30 | Disposition: A | Discharge status: Home / Self Care | Payer: Self-pay | Attending: Emergency Medicine | Admitting: Emergency Medicine

## 2014-05-30 ENCOUNTER — Emergency Department (HOSPITAL_COMMUNITY): Payer: Self-pay

## 2014-05-30 DIAGNOSIS — N508 Other specified disorders of male genital organs: Secondary | ICD-10-CM | POA: Insufficient documentation

## 2014-05-30 DIAGNOSIS — N50819 Testicular pain, unspecified: Secondary | ICD-10-CM

## 2014-05-30 DIAGNOSIS — Z7951 Long term (current) use of inhaled steroids: Secondary | ICD-10-CM | POA: Insufficient documentation

## 2014-05-30 DIAGNOSIS — Z720 Tobacco use: Secondary | ICD-10-CM | POA: Insufficient documentation

## 2014-05-30 DIAGNOSIS — Z79899 Other long term (current) drug therapy: Secondary | ICD-10-CM | POA: Insufficient documentation

## 2014-05-30 DIAGNOSIS — R300 Dysuria: Secondary | ICD-10-CM | POA: Insufficient documentation

## 2014-05-30 LAB — URINALYSIS, ROUTINE W REFLEX MICROSCOPIC
BILIRUBIN URINE: NEGATIVE
Glucose, UA: NEGATIVE mg/dL
Hgb urine dipstick: NEGATIVE
KETONES UR: NEGATIVE mg/dL
Leukocytes, UA: NEGATIVE
Nitrite: NEGATIVE
PH: 6 (ref 5.0–8.0)
Protein, ur: NEGATIVE mg/dL
SPECIFIC GRAVITY, URINE: 1.014 (ref 1.005–1.030)
Urobilinogen, UA: 0.2 mg/dL (ref 0.0–1.0)

## 2014-05-30 MED ORDER — CEFTRIAXONE SODIUM 250 MG IJ SOLR
250.0000 mg | Freq: Once | INTRAMUSCULAR | Status: AC
  Administered 2014-05-30: 250 mg via INTRAMUSCULAR
  Filled 2014-05-30: qty 250

## 2014-05-30 MED ORDER — AZITHROMYCIN 250 MG PO TABS
1000.0000 mg | ORAL_TABLET | Freq: Once | ORAL | Status: AC
  Administered 2014-05-30: 1000 mg via ORAL
  Filled 2014-05-30: qty 4

## 2014-05-30 MED ORDER — LIDOCAINE HCL (PF) 1 % IJ SOLN
INTRAMUSCULAR | Status: AC
  Administered 2014-05-30: 2 mL
  Filled 2014-05-30: qty 5

## 2014-05-30 NOTE — ED Notes (Signed)
Pt presents with c/o burning and discomfort with urination. Pt reports that these symptoms have been going on for about a week. Pt denies any hematuria.

## 2014-05-30 NOTE — ED Provider Notes (Signed)
CSN: [id number]     Arrival date & time 05/30/14  1446 History   First MD Initiated Contact with Patient 05/30/14 1619     Chief Complaint  Patient presents with  . Dysuria     (Consider location/radiation/quality/duration/timing/severity/associated sxs/prior Treatment) The history is provided by the patient. No language interpreter was used.  Mark Singleton isa 44 y/o M with no known significant PMHx presenting to the ED with dysuria that has been ongoing for the past week. Patient reported that after he urinates he experiences a stinging sensation at the tip of his penis. Patient reported that he has noticed some discomfort in the scrotum a couple of nights ago. Stated that nothing makes the pain better or worse, stated that he has not taken anything for the discomfort. Reported that he is sexually active, but does not use protection. Reported that he does have history of Chlamydia in the past. Denied penile swelling/sores/discharge, hematuria, abdominal pain, nausea, vomiting, diarrhea, melena, hematochezia, poor stream, frequency, eating or drinking changes, back pain, chest pain, shortness of breath, difficulty breathing, fever, chills. PCP none   History reviewed. No pertinent past medical history. History reviewed. No pertinent past surgical history. No family history on file. History  Substance Use Topics  . Smoking status: Current Some Day Smoker    Types: Cigarettes  . Smokeless tobacco: Not on file  . Alcohol Use: Yes     Comment: occ    Review of Systems  Constitutional: Negative for fever and chills.  Respiratory: Negative for chest tightness and shortness of breath.   Cardiovascular: Negative for chest pain.  Gastrointestinal: Negative for nausea, vomiting, abdominal pain and diarrhea.  Genitourinary: Positive for dysuria and testicular pain. Negative for urgency, frequency, hematuria, decreased urine volume, discharge, penile swelling, scrotal swelling and penile pain.   Musculoskeletal: Negative for back pain, neck pain and neck stiffness.      Allergies  Review of patient's allergies indicates no known allergies.  Home Medications   Prior to Admission medications   Medication Sig Start Date End Date Taking? Authorizing Provider  fluticasone (FLONASE) 50 MCG/ACT nasal spray Place 2 sprays into both nostrils daily. Patient not taking: Reported on 05/30/2014 01/21/14   Rodolph Bong, MD  omeprazole (PRILOSEC) 40 MG capsule Take 1 capsule (40 mg total) by mouth daily. Patient not taking: Reported on 05/30/2014 01/21/14   Rodolph Bong, MD   BP 122/74 mmHg  Pulse 68  Temp(Src) 98.2 F (36.8 C) (Oral)  Resp 18  SpO2 98% Physical Exam  Constitutional: He is oriented to person, place, and time. He appears well-developed and well-nourished. No distress.  HENT:  Head: Normocephalic and atraumatic.  Eyes: Conjunctivae and EOM are normal. Right eye exhibits no discharge. Left eye exhibits no discharge.  Neck: Normal range of motion. Neck supple. No tracheal deviation present.  Cardiovascular: Normal rate, regular rhythm and normal heart sounds.   Pulses:      Radial pulses are 2+ on the right side, and 2+ on the left side.  Pulmonary/Chest: Effort normal and breath sounds normal. No respiratory distress. He has no wheezes. He has no rales.  Abdominal: Soft. Bowel sounds are normal. He exhibits no distension. There is no tenderness. There is no rebound and no guarding.  Genitourinary: Penis normal. No penile tenderness.  Peile Exam: Negative swelling, inflammation, erythema, lesions, sores, deformities, chancres, bleeding, red streaks, pustules noted on examination. Negative high riding testicle. Negative hydrocele, varicocele. Negative swelling, erythema, inflammation, lesions, sores, deformities  noted to the scrotum. Mild discomfort upon palpation to the testicles bilaterally. Negative inguinal lymphadenopathy bilaterally.  Exam chaperoned with nurse, Hessie DienerAlan.    Musculoskeletal: Normal range of motion.  Lymphadenopathy:    He has no cervical adenopathy.  Neurological: He is alert and oriented to person, place, and time. No cranial nerve deficit. He exhibits normal muscle tone. Coordination normal.  Skin: Skin is warm and dry. No rash noted. He is not diaphoretic. No erythema.  Psychiatric: He has a normal mood and affect. His behavior is normal. Thought content normal.  Nursing note and vitals reviewed.   ED Course  Procedures (including critical care time)  Results for orders placed or performed during the hospital encounter of 05/30/14  Urinalysis, Routine w reflex microscopic (if pt has temp above 100.74F)  Result Value Ref Range   Color, Urine YELLOW YELLOW   APPearance CLEAR CLEAR   Specific Gravity, Urine 1.014 1.005 - 1.030   pH 6.0 5.0 - 8.0   Glucose, UA NEGATIVE NEGATIVE mg/dL   Hgb urine dipstick NEGATIVE NEGATIVE   Bilirubin Urine NEGATIVE NEGATIVE   Ketones, ur NEGATIVE NEGATIVE mg/dL   Protein, ur NEGATIVE NEGATIVE mg/dL   Urobilinogen, UA 0.2 0.0 - 1.0 mg/dL   Nitrite NEGATIVE NEGATIVE   Leukocytes, UA NEGATIVE NEGATIVE    Labs Review Labs Reviewed  URINALYSIS, ROUTINE W REFLEX MICROSCOPIC  GC/CHLAMYDIA PROBE AMP ([place])    Imaging Review [place] Scrotum  05/30/2014   CLINICAL DATA:  Testicular pain and dysuria for 1 week.  EXAM: SCROTAL ULTRASOUND  DOPPLER ULTRASOUND OF THE TESTICLES  TECHNIQUE: Complete ultrasound examination of the testicles, epididymis, and other scrotal structures was performed. Color and spectral Doppler ultrasound were also utilized to evaluate blood flow to the testicles.  COMPARISON:  None.  FINDINGS: Right testicle  Measurements: 4.5 x 2.2 x 2.8 cm. No intratesticular mass. Scattered minor microlithiasis noted. No focal abnormality.  Left testicle  Measurements: 4.1 x 2.5 x 2.5 cm. Symmetric appearance. No intratesticular mass. Scattered Minor microlithiasis.  Right epididymis: Normal in size  and appearance. Small hypoechoic cyst in the tail measures 4 mm.  Left epididymis: Normal in size and appearance. Small hypoechoic cyst inferiorly measures 7 mm  Hydrocele:  None visualized.  Varicocele:  None visualized.  Pulsed Doppler interrogation of both testes demonstrates normal low resistance arterial and venous waveforms bilaterally.  IMPRESSION: Minor bilateral testicular microlithiasis. No intratesticular abnormality.  Normal scrotal Doppler exam without evidence of torsion.  Incidental tiny epididymal cysts.   Electronically Signed   By: Ruel Favorsrevor  Shick M.D.   On: 05/30/2014 19:59   [place] Art/ven Flow Abd Pelv Doppler  05/30/2014   CLINICAL DATA:  Testicular pain and dysuria for 1 week.  EXAM: SCROTAL ULTRASOUND  DOPPLER ULTRASOUND OF THE TESTICLES  TECHNIQUE: Complete ultrasound examination of the testicles, epididymis, and other scrotal structures was performed. Color and spectral Doppler ultrasound were also utilized to evaluate blood flow to the testicles.  COMPARISON:  None.  FINDINGS: Right testicle  Measurements: 4.5 x 2.2 x 2.8 cm. No intratesticular mass. Scattered minor microlithiasis noted. No focal abnormality.  Left testicle  Measurements: 4.1 x 2.5 x 2.5 cm. Symmetric appearance. No intratesticular mass. Scattered Minor microlithiasis.  Right epididymis: Normal in size and appearance. Small hypoechoic cyst in the tail measures 4 mm.  Left epididymis: Normal in size and appearance. Small hypoechoic cyst inferiorly measures 7 mm  Hydrocele:  None visualized.  Varicocele:  None visualized.  Pulsed Doppler interrogation of both  testes demonstrates normal low resistance arterial and venous waveforms bilaterally.  IMPRESSION: Minor bilateral testicular microlithiasis. No intratesticular abnormality.  Normal scrotal Doppler exam without evidence of torsion.  Incidental tiny epididymal cysts.   Electronically Signed   By: Ruel Favors M.D.   On: 05/30/2014 19:59     EKG Interpretation None       MDM   Final diagnoses:  Testicular pain  Dysuria    Medications  cefTRIAXone (ROCEPHIN) injection 250 mg (250 mg Intramuscular Given 05/30/14 2044)  azithromycin (ZITHROMAX) tablet 1,000 mg (1,000 mg Oral Given 05/30/14 2040)  lidocaine (PF) (XYLOCAINE) 1 % injection (2 mLs  Given 05/30/14 2042)    Filed Vitals:   05/30/14 1509 05/30/14 1910  BP: 124/68 122/74  Pulse: 60 68  Temp: 98.2 F (36.8 C) 98.2 F (36.8 C)  TempSrc: Oral Oral  Resp: 16 18  SpO2: 100% 98%   UA unremarkable - negative nitrites, leukocytes,hemoglobin. GC/Chlamydia probe pending. Scrotal ultrasound noted minor bilateral testicular Michael listhesis. No intratesticular abnormalities noted. Normal scrotal Doppler without evidence of torsion. Doubt torsion. Doubt orchitis or inflammatory process. Negative findings of UTI or pyelonephritis. Unremarkable physical examination were no signs of lesions or sores. Definitive etiology of dysuria unknown. Patient covered prophylactically with antibiotics for STD. Patient stable, afebrile. Patient not septic appearing. Discharged patient. Referred patient to [facility] and Urology. Discussed with patient to avoid any physical or strenuous activity. Discussed with patient to closely monitor symptoms and if symptoms are to worsen or change to report back to the ED - strict return instructions given.  Patient agreed to plan of care, understood, all questions answered.     Raymon Mutton, PA-C 05/30/14 2110  Candyce Churn III, MD 05/31/14 1700

## 2014-05-30 NOTE — ED Notes (Signed)
Ultrasound in process at bedside at this time. 

## 2014-05-30 NOTE — ED Notes (Signed)
Pt ambulating independently w/ steady gait on d/c in no acute distress, A&Ox4.  

## 2014-05-30 NOTE — Discharge Instructions (Signed)
Please call your doctor for a followup appointment within 24-48 hours. When you talk to your doctor please let them know that you were seen in the emergency department and have them acquire all of your records so that they can discuss the findings with you and formulate a treatment plan to fully care for your new and ongoing problems. Please follow-up with health and wellness Center Please follow-up with urology Please avoid any physical strenuous activity Please always practices safe sex habits until further labs returned Please continue to monitor symptoms closely and if symptoms are to worsen or change (fever greater than 101, chills, sweating, nausea, vomiting, chest pain, shortness of breathe, difficulty breathing, weakness, numbness, tingling, worsening or changes to pain pattern, Penile swelling, sores, lesions, penile discharge, testicular swelling or pain) please report back to the Emergency Department immediately.    Dysuria Dysuria is the medical term for pain with urination. There are many causes for dysuria, but urinary tract infection is the most common. If a urinalysis was performed it can show that there is a urinary tract infection. A urine culture confirms that you or your child is sick. You will need to follow up with a healthcare provider because:  If a urine culture was done you will need to know the culture results and treatment recommendations.  If the urine culture was positive, you or your child will need to be put on antibiotics or know if the antibiotics prescribed are the right antibiotics for your urinary tract infection.  If the urine culture is negative (no urinary tract infection), then other causes may need to be explored or antibiotics need to be stopped. Today laboratory work may have been done and there does not seem to be an infection. If cultures were done they will take at least 24 to 48 hours to be completed. Today x-rays may have been taken and they read as  normal. No cause can be found for the problems. The x-rays may be re-read by a radiologist and you will be contacted if additional findings are made. You or your child may have been put on medications to help with this problem until you can see your primary caregiver. If the problems get better, see your primary caregiver if the problems return. If you were given antibiotics (medications which kill germs), take all of the mediations as directed for the full course of treatment.  If laboratory work was done, you need to find the results. Leave a telephone number where you can be reached. If this is not possible, make sure you find out how you are to get test results. HOME CARE INSTRUCTIONS   Drink lots of fluids. For adults, drink eight, 8 ounce glasses of clear juice or water a day. For children, replace fluids as suggested by your caregiver.  Empty the bladder often. Avoid holding urine for long periods of time.  After a bowel movement, women should cleanse front to back, using each tissue only once.  Empty your bladder before and after sexual intercourse.  Take all the medicine given to you until it is gone. You may feel better in a few days, but TAKE ALL MEDICINE.  Avoid caffeine, tea, alcohol and carbonated beverages, because they tend to irritate the bladder.  In men, alcohol may irritate the prostate.  Only take over-the-counter or prescription medicines for pain, discomfort, or fever as directed by your caregiver.  If your caregiver has given you a follow-up appointment, it is very important to keep that appointment.  Not keeping the appointment could result in a chronic or permanent injury, pain, and disability. If there is any problem keeping the appointment, you must call back to this facility for assistance. SEEK IMMEDIATE MEDICAL CARE IF:   Back pain develops.  A fever develops.  There is nausea (feeling sick to your stomach) or vomiting (throwing up).  Problems are no better  with medications or are getting worse. MAKE SURE YOU:   Understand these instructions.  Will watch your condition.  Will get help right away if you are not doing well or get worse. Document Released: 01/05/2004 Document Revised: 07/01/2011 Document Reviewed: 11/12/2007 [facility] Patient Information 2015 [place], [place]. This information is not intended to replace advice given to you by your health care provider. Make sure you discuss any questions you have with your health care provider.   Emergency Department Resource Guide 1) Find a Doctor and Pay Out of Pocket Although you won't have to find out who is covered by your insurance plan, it is a good idea to ask around and get recommendations. You will then need to call the office and see if the doctor you have chosen will accept you as a new patient and what types of options they offer for patients who are self-pay. Some doctors offer discounts or will set up payment plans for their patients who do not have insurance, but you will need to ask so you aren't surprised when you get to your appointment.  2) Contact Your Local Health Department Not all health departments have doctors that can see patients for sick visits, but many do, so it is worth a call to see if yours does. If you don't know where your local health department is, you can check in your phone book. The CDC also has a tool to help you locate your state's health department, and many state websites also have listings of all of their local health departments.  3) Find a Walk-in Clinic If your illness is not likely to be very severe or complicated, you may want to try a walk in clinic. These are popping up all over the country in pharmacies, drugstores, and shopping centers. They're usually staffed by nurse practitioners or physician assistants that have been trained to treat common illnesses and complaints. They're usually fairly quick and inexpensive. However, if you have serious  medical issues or chronic medical problems, these are probably not your best option.  No Primary Care Doctor: - Call Health Connect at  [phone] - they can help you locate a primary care doctor that  accepts your insurance, provides certain services, etc. - Physician Referral Service- [phone]  Chronic Pain Problems: Organization         Address  Phone   Notes  [facility]  [phone] Patients need to be referred by their primary care doctor.   Medication Assistance: Organization         Address  Phone   Notes  [facility] Medication [facility] [address] [place], [place] [address] [phone] --Must be a resident of [facility] -- Must have NO insurance coverage whatsoever (no Medicaid/ Medicare, etc.) -- The pt. MUST have a primary care doctor that directs their care regularly and follows them in the community   MedAssist  [phone]   [employer]  [phone]    Agencies that provide inexpensive medical care: Retail buyer  Notes  [facility]  [phone]   [facility]    [phone]   [facility] [address] [place], [place] [address] [phone]) [phone]   [facility] [address], [place] [phone]   Planned Parenthood    [phone](336) [phone]   [facility]    [phone]   [facility]  [address], [place] Phone:  [phone](336) [phone], Fax:  [phone](336) [phone] Hours of Operation:  9 am - 6 pm, M-F.  Also accepts Medicaid/Medicare and self-pay.  [facility]  [address], [place] Phone: [phone]) [phone], Fax: [phone]) [phone]. Hours of Operation:  8:30 am - 5:30 pm, M-F.  Also accepts Medicaid and self-pay.  [facility] [place] [address], [place] Phone: [phone](336) [phone]   [facility] [address], [place]  [place], [place] [phone], Ext. [phone] Mondays & Thursdays: 7-9 AM.  First 15 patients are seen on a first come, first serve basis.    Medicaid-accepting [facility] Providers:  Organization         Address  Phone   Notes  [facility] [address], [place] [phone] Also accepts self-pay patients.  [facility] [address], [place]  [phone]) [phone]   [facility] [address], [place] [phone](336) [phone]   [facility] [address] [place] [address], [place] [phone]) [phone]   Renaye RakersVeita Bland [address], [place]   [phone]) [phone] Only accepts [place] Access [place] patients after they have their name applied to their card.   Self-Pay (no insurance) in [facility]:  Organization         Address  Phone   Notes  Sickle Cell Patients, [facility] [address], [place] [phone]) [phone]   [facility] [address], [place] [phone]) [phone]   [facility]  [address], [place] [phone]   [facility]/Dr. Osei-Bonsu  [address] [place] [address], B and [place] or [phone], [place] [phone]) [phone] Phone number for both [place] and [place] locations is the same.  [facility] [address], [place] [phone]   [facility] [place] [address] [place] [address], [place] or [address] [phone]   [facility] [address], [place] [phone]) [phone], phone; [phone]) [phone], fax Sees patients 1st and 3rd Saturday of every month.  Must not qualify for public or private insurance (i.e. Medicaid, Medicare, [place] Health Choice, Veterans' Benefits)  Household income should be no more than 200% of the poverty level The clinic cannot treat you if you are pregnant or think you are pregnant  Sexually  transmitted diseases are not treated at the clinic.    Dental Care: Organization         Address  Phone  Notes  [facility] of [facility] [address], [place] [phone] Accepts children up to age 43 who are enrolled in [place]icaid or [place] Health Choice; pregnant women with a Medicaid card; and children who have applied for Medicaid or [place] Health Choice, but were declined, whose parents can pay a reduced fee at time of service.  [facility]  Department of [facility]  [address], [place] [phone] Accepts children up to age 71 who are enrolled in [place] or [place]; pregnant women with a Medicaid card; and children who have applied for Medicaid or [place] Health Choice, but were declined, whose parents can pay a reduced fee at time of service.  [facility]  [place] [phone] Patients are seen by appointment only. Walk-ins are not accepted. [place] will see patients 1 years of age and older. Monday - Tuesday (8am-5pm) Most Wednesdays (8:30-5pm) $30 per visit, cash only  [facility] Adult Dental Access PROGRAM  [address], [facility] [phone] Patients are seen by appointment only. Walk-ins are not accepted. [place] will see patients 44 years of age and older. One Wednesday Evening (Monthly: Volunteer Based).  $30 per visit, cash only  [place]  [phone] for adults; Children under age 75, call Graduate Pediatric Dentistry at [phone]. Children aged 51-14, please call [phone] to request a pediatric application.  Dental services are provided in all areas of dental care including fillings, crowns and bridges, complete and partial dentures, implants, gum treatment, root canals, and extractions. Preventive care is also provided. Treatment is provided to both adults and children. Patients are  selected via a lottery and there is often a waiting list.   [facility] [address], [place]  [phone] [email or web address]   [facility] [address] [place], [place] [phone], Ext. [phone] Second and Fourth Thursday of each month, opens at 6:30 AM; Clinic ends at 9 AM.  Patients are seen on a first-come first-served basis, and a limited number are seen during each clinic.   [facility]  [address] [place]ard Danker [place], [place] [phone]   Eligibility Requirements You must have lived in [place], [place], or [place] counties for at least the last three months.   You cannot be eligible for state or federal sponsored [employer], including [employer], [place], or [employer].   You generally cannot be eligible for healthcare insurance through your employer.    How to apply: Eligibility screenings are held every Tuesday and Wednesday afternoon from 1:00 pm until 4:00 pm. You do not need an appointment for the interview!  [facility] [address] [place], [place]   [place]  [place] Department  [place]  [phone]    Behavioral Health Resources in the Community: Intensive Outpatient Programs Organization         Address  Phone  Notes  [facility] [place]. [address] [place], [place] [phone]   [facility] Outpatient [address] [place], [place]   ADS: [facility] Drug S[facility] [address] [place], [place]   [place] [address]  [place], [place] or [phone]   Substance Abuse Resources Organization         Address  Phone  Notes  Al[facility] Drug Services  [phone]   [place]  [phone]   The [place]   Chinita Pester  [phone]     Residential & Outpatient Substance Abuse Program  [phone]   Psychological Services Organization         Address  Phone  Notes  [facility]  [phone]- [phone]   [facility]  [phone]- [phone]   [facility] [address], [place] [phone] or [phone]    Mobile Crisis Teams Organization         Address  Phone  Notes  [facility], Mobile Crisis Care Unit  [phone]   [facility]  [address] [place], [place] [phone]   Doristine LocksSharon DeEsch [address] [place] [phone]    Self-Help/Support Groups Organization         Address  Phone             Notes  [facility] [place] - variety of support groups  336- [phone] Call for more information  Narcotics Anonymous (NA), [facility] [address], [place]  2 meetings at this location   Statisticianesidential Treatment Programs Organization         Address  Phone  Notes  [facility] [address],    [place]  [phone]   [facility]  [address], [place] [phone], [place], [place] [phone]   [facility] [address], [place] [phone] Admissions: 8am-3pm M-F  [facility] [address]    [place], [place] [phone]   [facility] [address], [place], [place] [phone]   [facility] [address]  [place], [place] [phone]   [facility] - Intensive Outpatient [address], [place], [place] [phone]   [facility] ([facility] Recovery [facility] [address]  [place], [place] [phone] or [phone]   [facility] (RTS) [address] [place], [place] [phone] Accepts Medicaid  [facility] [address]  [place] [phone] Substance Abuse/Addiction Treatment   [facility]  Resources Organization         Address  Phone  Notes  [facility]  [phone]) [phone]   Angie FavaJulie Brannon, PhD [address], [address] [place], [place]   [phone]) [phone] or [phone]) [phone]   [facility]   [address] [place], [place] [phone](336) [phone]   [facility] [address], [place], [place] [phone]) [phone] Insurance/Medicaid/sponsorship through [facility]  [facility] Families [address]                                    [place], [place] [phone] Therapy/tele-psych/case  [facility] [address].   [place], [place] [phone](336) [phone]    Dr. Lolly MustacheArfeen  [phone](336) [phone]   [facility] [place]  [employer] [facility] 1) [address], [place] 2) [address], [place] 3)  [address], [place] [phone]) [phone]  [phone](336) [phone]   [facility] Child Abuse Hotline [phone](336) [phone] or [phone](336) [phone] (After Hours)

## 2014-05-31 LAB — GC/CHLAMYDIA PROBE AMP (~~LOC~~) NOT AT ARMC
CHLAMYDIA, DNA PROBE: NEGATIVE
NEISSERIA GONORRHEA: NEGATIVE

## 2014-09-12 ENCOUNTER — Emergency Department (HOSPITAL_COMMUNITY)
Admission: EM | Admit: 2014-09-12 | Discharge: 2014-09-12 | Disposition: A | Discharge status: Home / Self Care | Payer: Self-pay | Attending: Emergency Medicine | Admitting: Emergency Medicine

## 2014-09-12 ENCOUNTER — Emergency Department (HOSPITAL_COMMUNITY): Payer: Self-pay

## 2014-09-12 ENCOUNTER — Encounter (HOSPITAL_COMMUNITY): Payer: Self-pay | Admitting: Emergency Medicine

## 2014-09-12 DIAGNOSIS — W1839XA Other fall on same level, initial encounter: Secondary | ICD-10-CM | POA: Insufficient documentation

## 2014-09-12 DIAGNOSIS — S92301A Fracture of unspecified metatarsal bone(s), right foot, initial encounter for closed fracture: Secondary | ICD-10-CM

## 2014-09-12 DIAGNOSIS — Z720 Tobacco use: Secondary | ICD-10-CM | POA: Insufficient documentation

## 2014-09-12 DIAGNOSIS — S92351A Displaced fracture of fifth metatarsal bone, right foot, initial encounter for closed fracture: Secondary | ICD-10-CM | POA: Insufficient documentation

## 2014-09-12 DIAGNOSIS — Y998 Other external cause status: Secondary | ICD-10-CM | POA: Insufficient documentation

## 2014-09-12 DIAGNOSIS — Y9389 Activity, other specified: Secondary | ICD-10-CM | POA: Insufficient documentation

## 2014-09-12 DIAGNOSIS — Y92410 Unspecified street and highway as the place of occurrence of the external cause: Secondary | ICD-10-CM | POA: Insufficient documentation

## 2014-09-12 MED ORDER — HYDROCODONE-ACETAMINOPHEN 5-325 MG PO TABS: 1.0000 | ORAL_TABLET | ORAL | Status: DC | PRN

## 2014-09-12 MED ORDER — IBUPROFEN 800 MG PO TABS
800.0000 mg | ORAL_TABLET | Freq: Once | ORAL | Status: DC
  Filled 2014-09-12: qty 1
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
## 2014-09-12 NOTE — Discharge Instructions (Signed)
Take ibuprofen and tylenol for pain, Use walking boot until you see ortho doctor. For severe pain take norco or vicodin however realize they have the potential for addiction and it can make you sleepy and has tylenol in it.  No operating machinery while taking. If you were given medicines take as directed.  If you are on coumadin or contraceptives realize their levels and effectiveness is altered by many different medicines.  If you have any reaction (rash, tongues swelling, other) to the medicines stop taking and see a physician.    If your blood pressure was elevated in the ER make sure you follow up for management with a primary doctor or return for chest pain, shortness of breath or stroke symptoms.  Please follow up as directed and return to the ER or see a physician for new or worsening symptoms.  Thank you. Filed Vitals:   09/12/14 0917  BP: 142/94  Pulse: 72  Temp: 98.2 F (36.8 C)  TempSrc: Oral  Resp: 16  SpO2: 100%

## 2015-03-08 ENCOUNTER — Emergency Department (HOSPITAL_COMMUNITY)
Admission: EM | Admit: 2015-03-08 | Discharge: 2015-03-09 | Disposition: A | Discharge status: Home / Self Care | Payer: No Typology Code available for payment source | Attending: Emergency Medicine | Admitting: Emergency Medicine

## 2015-03-08 ENCOUNTER — Encounter (HOSPITAL_COMMUNITY): Payer: Self-pay | Admitting: Emergency Medicine

## 2015-03-08 DIAGNOSIS — S161XXA Strain of muscle, fascia and tendon at neck level, initial encounter: Secondary | ICD-10-CM | POA: Insufficient documentation

## 2015-03-08 DIAGNOSIS — Y998 Other external cause status: Secondary | ICD-10-CM | POA: Insufficient documentation

## 2015-03-08 DIAGNOSIS — F17210 Nicotine dependence, cigarettes, uncomplicated: Secondary | ICD-10-CM | POA: Insufficient documentation

## 2015-03-08 DIAGNOSIS — Y9389 Activity, other specified: Secondary | ICD-10-CM | POA: Diagnosis not present

## 2015-03-08 DIAGNOSIS — S3992XA Unspecified injury of lower back, initial encounter: Secondary | ICD-10-CM | POA: Diagnosis present

## 2015-03-08 DIAGNOSIS — S80212A Abrasion, left knee, initial encounter: Secondary | ICD-10-CM | POA: Insufficient documentation

## 2015-03-08 DIAGNOSIS — S0990XA Unspecified injury of head, initial encounter: Secondary | ICD-10-CM | POA: Insufficient documentation

## 2015-03-08 DIAGNOSIS — S39012A Strain of muscle, fascia and tendon of lower back, initial encounter: Secondary | ICD-10-CM

## 2015-03-08 DIAGNOSIS — Y92410 Unspecified street and highway as the place of occurrence of the external cause: Secondary | ICD-10-CM | POA: Insufficient documentation

## 2015-03-08 MED ORDER — HYDROCODONE-ACETAMINOPHEN 5-325 MG PO TABS: 2.0000 | ORAL_TABLET | ORAL | Status: DC | PRN

## 2015-03-08 MED ORDER — IBUPROFEN 800 MG PO TABS: 800.0000 mg | ORAL_TABLET | Freq: Three times a day (TID) | ORAL | Status: DC

## 2015-03-08 MED ORDER — CYCLOBENZAPRINE HCL 10 MG PO TABS: 10.0000 mg | ORAL_TABLET | Freq: Three times a day (TID) | ORAL | Status: DC | PRN
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
## 2015-03-18 ENCOUNTER — Emergency Department (HOSPITAL_COMMUNITY): Payer: No Typology Code available for payment source

## 2015-03-18 ENCOUNTER — Encounter (HOSPITAL_COMMUNITY): Payer: Self-pay

## 2015-03-18 ENCOUNTER — Emergency Department (HOSPITAL_COMMUNITY)
Admission: EM | Admit: 2015-03-18 | Discharge: 2015-03-18 | Disposition: A | Discharge status: Home / Self Care | Payer: No Typology Code available for payment source | Attending: Emergency Medicine | Admitting: Emergency Medicine

## 2015-03-18 DIAGNOSIS — F17210 Nicotine dependence, cigarettes, uncomplicated: Secondary | ICD-10-CM | POA: Diagnosis not present

## 2015-03-18 DIAGNOSIS — Y998 Other external cause status: Secondary | ICD-10-CM | POA: Insufficient documentation

## 2015-03-18 DIAGNOSIS — Y9389 Activity, other specified: Secondary | ICD-10-CM | POA: Diagnosis not present

## 2015-03-18 DIAGNOSIS — R0789 Other chest pain: Secondary | ICD-10-CM

## 2015-03-18 DIAGNOSIS — Y92410 Unspecified street and highway as the place of occurrence of the external cause: Secondary | ICD-10-CM | POA: Insufficient documentation

## 2015-03-18 DIAGNOSIS — R05 Cough: Secondary | ICD-10-CM | POA: Insufficient documentation

## 2015-03-18 DIAGNOSIS — S29001A Unspecified injury of muscle and tendon of front wall of thorax, initial encounter: Secondary | ICD-10-CM | POA: Insufficient documentation

## 2015-03-18 DIAGNOSIS — S3991XA Unspecified injury of abdomen, initial encounter: Secondary | ICD-10-CM | POA: Diagnosis not present

## 2015-03-18 LAB — CBC WITH DIFFERENTIAL/PLATELET
BASOS PCT: 1 %
Basophils Absolute: 0 10*3/uL (ref 0.0–0.1)
Eosinophils Absolute: 0.1 10*3/uL (ref 0.0–0.7)
Eosinophils Relative: 2 %
HEMATOCRIT: 40.5 % (ref 39.0–52.0)
HEMOGLOBIN: 13.6 g/dL (ref 13.0–17.0)
LYMPHS ABS: 1.5 10*3/uL (ref 0.7–4.0)
Lymphocytes Relative: 34 %
MCH: 28.6 pg (ref 26.0–34.0)
MCHC: 33.6 g/dL (ref 30.0–36.0)
MCV: 85.3 fL (ref 78.0–100.0)
MONOS PCT: 9 %
Monocytes Absolute: 0.4 10*3/uL (ref 0.1–1.0)
NEUTROS PCT: 54 %
Neutro Abs: 2.4 10*3/uL (ref 1.7–7.7)
Platelets: 164 10*3/uL (ref 150–400)
RBC: 4.75 MIL/uL (ref 4.22–5.81)
RDW: 13.8 % (ref 11.5–15.5)
WBC: 4.4 10*3/uL (ref 4.0–10.5)

## 2015-03-18 LAB — COMPREHENSIVE METABOLIC PANEL
ALT: 21 U/L (ref 17–63)
ANION GAP: 6 (ref 5–15)
AST: 25 U/L (ref 15–41)
Albumin: 3.9 g/dL (ref 3.5–5.0)
Alkaline Phosphatase: 45 U/L (ref 38–126)
BUN: 15 mg/dL (ref 6–20)
CALCIUM: 9.1 mg/dL (ref 8.9–10.3)
CHLORIDE: 107 mmol/L (ref 101–111)
CO2: 26 mmol/L (ref 22–32)
Creatinine, Ser: 0.95 mg/dL (ref 0.61–1.24)
GFR calc Af Amer: >60 mL/min (ref >60)
GFR calc non Af Amer: >60 mL/min (ref >60)
Glucose, Bld: 149 mg/dL — ABNORMAL HIGH (ref 65–99)
Potassium: 3.9 mmol/L (ref 3.5–5.1)
Sodium: 139 mmol/L (ref 135–145)
Total Bilirubin: 0.6 mg/dL (ref 0.3–1.2)
Total Protein: 6.7 g/dL (ref 6.5–8.1)

## 2015-03-18 MED ORDER — IBUPROFEN 400 MG PO TABS
600.0000 mg | ORAL_TABLET | Freq: Once | ORAL | Status: AC
  Administered 2015-03-18: 600 mg via ORAL
  Filled 2015-03-18: qty 1

## 2015-03-18 MED ORDER — IBUPROFEN 600 MG PO TABS: 600.0000 mg | ORAL_TABLET | Freq: Three times a day (TID) | ORAL | Status: DC | PRN
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
## 2016-01-15 ENCOUNTER — Emergency Department (HOSPITAL_COMMUNITY)
Admission: EM | Admit: 2016-01-15 | Discharge: 2016-01-15 | Disposition: A | Discharge status: Home / Self Care | Payer: Self-pay | Attending: Emergency Medicine | Admitting: Emergency Medicine

## 2016-01-15 ENCOUNTER — Encounter (HOSPITAL_COMMUNITY): Payer: Self-pay

## 2016-01-15 DIAGNOSIS — K0889 Other specified disorders of teeth and supporting structures: Secondary | ICD-10-CM | POA: Insufficient documentation

## 2016-01-15 DIAGNOSIS — F17210 Nicotine dependence, cigarettes, uncomplicated: Secondary | ICD-10-CM | POA: Insufficient documentation

## 2016-01-15 MED ORDER — PENICILLIN V POTASSIUM 500 MG PO TABS: 500.0000 mg | ORAL_TABLET | Freq: Three times a day (TID) | ORAL | 0 refills | Status: DC

## 2016-01-15 MED ORDER — NAPROXEN 500 MG PO TABS: 500.0000 mg | ORAL_TABLET | Freq: Two times a day (BID) | ORAL | 0 refills | Status: DC

## 2016-01-15 NOTE — ED Triage Notes (Signed)
Pt reports he has had dental pain X2 months. Pt has abscess to left upper gum line. Pt reports the pain was initially in his right jaw and now in the left. Pt denies shoulder or chest pain.

## 2016-01-15 NOTE — ED Provider Notes (Signed)
[facility]-EMERGENCY DEPT Provider Note   CSN: [id number] Arrival date & time: 01/15/16  1302  By signing my name below, I, Clovis Pu, attest that this documentation has been prepared under the direction and in the presence of  Felicie Morn, NP. Electronically Signed: Clovis Pu, ED Scribe. 01/15/16. 4:28 PM.    History   Chief Complaint Chief Complaint  Patient presents with  . Dental Pain    The history is provided by the patient. No language interpreter was used.  Dental Pain   This is a chronic problem. The problem has not changed since onset.The pain is moderate. He has tried nothing for the symptoms.   HPI Comments:  Mark Singleton is a 45 y.o. male who presents to the Emergency Department complaining of moderate left sided jaw and facial pain onset 2 months ago.  Pt states the pain began on the right side of his mouth due to a cracked tooth but notes his left jaw and face currently hurts. He states the pain is not directly from his teeth on the left side of his mouth. Pt does not have a dentist. He denies fevers, chills, or trouble swallowing.  No alleviating factors noted.   History reviewed. No pertinent past medical history.  There are no active problems to display for this patient.   History reviewed. No pertinent surgical history.     Home Medications    Prior to Admission medications   Medication Sig Start Date End Date Taking? Authorizing Provider  cyclobenzaprine (FLEXERIL) 10 MG tablet Take 1 tablet (10 mg total) by mouth 3 (three) times daily as needed for muscle spasms. Patient not taking: Reported on 03/18/2015 03/08/15   Danelle Berry, PA-C  fluticasone (FLONASE) 50 MCG/ACT nasal spray Place 2 sprays into both nostrils daily. Patient not taking: Reported on 05/30/2014 01/21/14   Rodolph Bong, MD  HYDROcodone-acetaminophen (NORCO/VICODIN) 5-325 MG tablet Take 2 tablets by mouth every 4 (four) hours as needed. Patient not taking: Reported on 03/18/2015 03/08/15    Danelle Berry, PA-C  ibuprofen (ADVIL,MOTRIN) 600 MG tablet Take 1 tablet (600 mg total) by mouth every 8 (eight) hours as needed. 03/18/15   Pricilla Loveless, MD  omeprazole (PRILOSEC) 40 MG capsule Take 1 capsule (40 mg total) by mouth daily. Patient not taking: Reported on 05/30/2014 01/21/14   Rodolph Bong, MD    Family History History reviewed. No pertinent family history.  Social History Social History  Substance Use Topics  . Smoking status: Current Some Day Smoker    Types: Cigarettes  . Smokeless tobacco: Never Used  . Alcohol use Yes     Comment: occ     Allergies   Review of patient's allergies indicates no known allergies.   Review of Systems Review of Systems  Constitutional: Negative for chills and fever.  HENT: Positive for dental problem. Negative for trouble swallowing.   All other systems reviewed and are negative.    Physical Exam Updated Vital Signs BP 125/87 (BP Location: Left Arm)   Pulse 74   Temp 98.2 F (36.8 C) (Oral)   Resp 16   Ht 5\' 7"  (1.702 m)   Wt 154 lb (69.9 kg)   SpO2 100%   BMI 24.12 kg/m   Physical Exam  Constitutional: He is oriented to person, place, and time. He appears well-developed and well-nourished. No distress.  HENT:  Head: Normocephalic and atraumatic.  Mouth/Throat:    Eyes: Conjunctivae are normal.  Cardiovascular: Normal rate.   Pulmonary/Chest:  Effort normal.  Abdominal: He exhibits no distension.  Neurological: He is alert and oriented to person, place, and time.  Skin: Skin is warm and dry.  Psychiatric: He has a normal mood and affect.  Nursing note and vitals reviewed.    ED Treatments / Results  DIAGNOSTIC STUDIES:  Oxygen Saturation is 100% on RA, normal by my interpretation.    COORDINATION OF CARE:  4:24 PM Discussed treatment plan with pt at bedside and pt agreed to plan.  Labs (all labs ordered are listed, but only abnormal results are displayed) Labs Reviewed - No data to  display  EKG  EKG Interpretation None       Radiology No results found.  Procedures Procedures (including critical care time)  Medications Ordered in ED Medications - No data to display   Initial Impression / Assessment and Plan / ED Course  I have reviewed the triage vital signs and the nursing notes.  Pertinent labs & imaging results that were available during my care of the patient were reviewed by me and considered in my medical decision making (see chart for details).  Clinical Course   Patient with dentalgia.  No abscess requiring immediate incision and drainage.  Exam not concerning for Ludwig's angina or pharyngeal abscess.  Will treat with penicillin and NSAID.Marland Kitchen. Pt instructed to follow-up with dentist.  Discussed return precautions. Pt safe for discharge.   Final Clinical Impressions(s) / ED Diagnoses   Final diagnoses:  None    New Prescriptions New Prescriptions   No medications on file  I personally performed the services described in this documentation, which was scribed in my presence. The recorded information has been reviewed and is accurate.     Felicie Mornavid Braxtyn Dorff, NP 01/16/16 0159    Loren Raceravid Yelverton, MD 01/17/16 [phone]

## 2016-01-15 NOTE — ED Notes (Signed)
Declined W/C at D/C and was escorted to lobby by RN. 

## 2017-04-14 ENCOUNTER — Emergency Department (HOSPITAL_COMMUNITY): Payer: Self-pay

## 2017-04-14 ENCOUNTER — Encounter (HOSPITAL_COMMUNITY): Payer: Self-pay | Admitting: Emergency Medicine

## 2017-04-14 ENCOUNTER — Emergency Department (HOSPITAL_COMMUNITY)
Admission: EM | Admit: 2017-04-14 | Discharge: 2017-04-14 | Disposition: A | Discharge status: Home / Self Care | Payer: Self-pay | Attending: Emergency Medicine | Admitting: Emergency Medicine

## 2017-04-14 DIAGNOSIS — M25511 Pain in right shoulder: Secondary | ICD-10-CM | POA: Insufficient documentation

## 2017-04-14 DIAGNOSIS — M79601 Pain in right arm: Secondary | ICD-10-CM | POA: Insufficient documentation

## 2017-04-14 DIAGNOSIS — R202 Paresthesia of skin: Secondary | ICD-10-CM | POA: Insufficient documentation

## 2017-04-14 DIAGNOSIS — F17210 Nicotine dependence, cigarettes, uncomplicated: Secondary | ICD-10-CM | POA: Insufficient documentation

## 2017-04-14 DIAGNOSIS — G8929 Other chronic pain: Secondary | ICD-10-CM | POA: Insufficient documentation

## 2017-04-14 DIAGNOSIS — Z79899 Other long term (current) drug therapy: Secondary | ICD-10-CM | POA: Insufficient documentation

## 2017-04-14 MED ORDER — METHOCARBAMOL 500 MG PO TABS: 500.0000 mg | ORAL_TABLET | Freq: Two times a day (BID) | ORAL | 0 refills | Status: AC

## 2017-04-14 MED ORDER — PREDNISONE 10 MG PO TABS: ORAL_TABLET | ORAL | 0 refills | Status: AC

## 2017-04-14 MED ORDER — MELOXICAM 7.5 MG PO TABS: 7.5000 mg | ORAL_TABLET | Freq: Every day | ORAL | 0 refills | Status: DC

## 2017-04-14 MED ORDER — METHOCARBAMOL 500 MG PO TABS: 500.0000 mg | ORAL_TABLET | Freq: Two times a day (BID) | ORAL | 0 refills | Status: DC

## 2017-04-14 MED ORDER — PREDNISONE 10 MG PO TABS: ORAL_TABLET | ORAL | 0 refills | Status: DC

## 2017-04-14 NOTE — Discharge Instructions (Signed)
It was my pleasure taking care of you today!  Ibuprofen/Tylenol as needed for pain.  Please do not exceed 4 g of Tylenol in 1 day.  He may take 600 mg of ibuprofen every 6 hours as needed.  Call the orthopedist listed today or tomorrow to schedule a follow up appointment for recheck of ongoing shoulder pain in 1-2 weeks that can be canceled with a 24-48 hour notice if complete resolution of pain.   Call the orthopedist listed if symptoms are not improved in one week.   Return to the ER for new or worsening symptoms, any additional concerns.  Medications:   You are prescribed Robaxin, a muscle relaxant. Some common side effects of this medication include:  Feeling sleepy.  Dizziness. Take care upon going from a seated to a standing position.  Dry mouth.  Feeling tired or weak.  Hard stools (constipation).  Upset stomach. These are not all of the side effects that may occur. If you have questions about side effects, call your doctor. Call your primary care provider for medical advice about side effects.  This medication can be sedating. Only take this medication as needed. Please do not combine with alcohol. Do not drive or operate machinery while taking this medication.   This medication can interact with some other medications. Make sure to tell any provider you are taking this medication before they prescribe you a new medication.   You are prescribed prednisone, a steroid in the ED. This is a medication to help reduce inflammation in the neck/spine and nerves.  Common side effects include upset stomach/nausea. You may take this medicine with food if this occurs. Other side effects include restlessness, difficulty sleeping, and increased sweating. Call your healthcare provider if these do not resolve after finishing the medication.  This medicine may increase your blood sugar so additional careful monitoring is needed of blood sugar if you have diabetes. Call your healthcare provider for  any signs/symtpoms of high blood sugar such as confusion, feeling sleepy, more thirst, more hunger, passing urine more often, flushing, fast breathing, or breath that smells like fruit.   COLD THERAPY DIRECTIONS:  Ice or gel packs can be used to reduce both pain and swelling. Ice is the most helpful within the first 24 to 48 hours after an injury or flareup from overusing a muscle or joint.  Ice is effective, has very few side effects, and is safe for most people to use.   If you expose your skin to cold temperatures for too long or without the proper protection, you can damage your skin or nerves. Watch for signs of skin damage due to cold.   HOME CARE INSTRUCTIONS  Follow these tips to use ice and cold packs safely.  Place a dry or damp towel between the ice and skin. A damp towel will cool the skin more quickly, so you may need to shorten the time that the ice is used.  For a more rapid response, add gentle compression to the ice.  Ice for no more than 10 to 20 minutes at a time. The bonier the area you are icing, the less time it will take to get the benefits of ice.  Check your skin after 5 minutes to make sure there are no signs of a poor response to cold or skin damage.  Rest 20 minutes or more in between uses.  Once your skin is numb, you can end your treatment. You can test numbness by very lightly touching  your skin. The touch should be so light that you do not see the skin dimple from the pressure of your fingertip. When using ice, most people will feel these normal sensations in this order: cold, burning, aching, and numbness.   Alternate cold therapy with SalonPAS patches, which provide heat.   Return instructions: Please return to the emergency department if you have any weakness associated with your arm pain, you have generalized muscular weakness, you notice that the weakness is on one entire side of the body, you have numbness in one entire side of the body, you have fever or  chills with neck and shoulder pain, or you develop worsening neck pain.
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
## 2017-04-14 NOTE — ED Triage Notes (Signed)
Patient here with complaints of right shoulder pain radiating down into fingers x6 months. Able to move arm all around.

## 2017-12-02 ENCOUNTER — Emergency Department (HOSPITAL_COMMUNITY)
Admission: EM | Admit: 2017-12-02 | Discharge: 2017-12-02 | Disposition: A | Discharge status: Home / Self Care | Payer: Self-pay | Attending: Emergency Medicine | Admitting: Emergency Medicine

## 2017-12-02 ENCOUNTER — Emergency Department (HOSPITAL_COMMUNITY): Payer: Self-pay

## 2017-12-02 DIAGNOSIS — F17210 Nicotine dependence, cigarettes, uncomplicated: Secondary | ICD-10-CM | POA: Insufficient documentation

## 2017-12-02 DIAGNOSIS — R0602 Shortness of breath: Secondary | ICD-10-CM | POA: Insufficient documentation

## 2017-12-02 DIAGNOSIS — R079 Chest pain, unspecified: Secondary | ICD-10-CM | POA: Insufficient documentation

## 2017-12-02 DIAGNOSIS — F1210 Cannabis abuse, uncomplicated: Secondary | ICD-10-CM | POA: Insufficient documentation

## 2017-12-02 LAB — BASIC METABOLIC PANEL
ANION GAP: 6 (ref 5–15)
BUN: 10 mg/dL (ref 6–20)
CALCIUM: 9.1 mg/dL (ref 8.9–10.3)
CO2: 27 mmol/L (ref 22–32)
Chloride: 108 mmol/L (ref 98–111)
Creatinine, Ser: 0.84 mg/dL (ref 0.61–1.24)
GFR calc Af Amer: >60 mL/min (ref >60)
GLUCOSE: 119 mg/dL — AB (ref 70–99)
POTASSIUM: 3.4 mmol/L — AB (ref 3.5–5.1)
Sodium: 141 mmol/L (ref 135–145)

## 2017-12-02 LAB — CBC
HEMATOCRIT: 41.9 % (ref 39.0–52.0)
HEMOGLOBIN: 13.6 g/dL (ref 13.0–17.0)
MCH: 27.5 pg (ref 26.0–34.0)
MCHC: 32.5 g/dL (ref 30.0–36.0)
MCV: 84.8 fL (ref 78.0–100.0)
Platelets: 206 10*3/uL (ref 150–400)
RBC: 4.94 MIL/uL (ref 4.22–5.81)
RDW: 13 % (ref 11.5–15.5)
WBC: 4.9 10*3/uL (ref 4.0–10.5)

## 2017-12-02 LAB — I-STAT TROPONIN, ED
Troponin i, poc: 0 ng/mL (ref 0.00–0.08)
Troponin i, poc: 0 ng/mL (ref 0.00–0.08)

## 2017-12-02 NOTE — Discharge Instructions (Addendum)
Mark Singleton:  Thank you for allowing us to take care of you today.  We hope you begin feeling better soon.  To-Do: Please follow-up with your primary doctor Please return to the Emergency Department or call 911 if you experience chest pain, shortness of breath, severe pain, severe fever, altered mental status, or have any reason to think that you need emergency medical care.  Thank you again.  Hope you feel better soon.

## 2017-12-02 NOTE — ED Provider Notes (Signed)
[facility] EMERGENCY DEPARTMENT Provider Note   CSN: [id number] Arrival date & time: 12/02/17  1432     History   Chief Complaint Chief Complaint  Patient presents with  . Chest Pain    HPI Mark Singleton is a 47 y.o. male with no reported prior medical history who presents due to 6 months of intermittent, nonexertional chest pain.  He says that he has episodes approximately every other day that occur while at rest and with activity where he has a "pulsing" sensation in his left chest.  He says that he feels weak when these episodes happen and that he is also short of breath with them.  He denies nausea, vomiting, and diaphoresis.  He has no history of VTE or VTE risk factors. His episodes last for 5-10 minutes.  He does not think that these are palpitations.  He has no family history of MI or sudden cardiac/unexplained death.  He denies lightheadedness and syncope.  He has not seen anyone else for the symptoms.  He says that Motrin alleviates his symptoms.  HPI  No past medical history on file.  There are no active problems to display for this patient.   No past surgical history on file.      Home Medications    Prior to Admission medications   Medication Sig Start Date End Date Taking? Authorizing Provider  ibuprofen (ADVIL,MOTRIN) 200 MG tablet Take 400-600 mg by mouth every 6 (six) hours as needed (for pain or headaches).    Yes [provider]  cyclobenzaprine (FLEXERIL) 10 MG tablet Take 1 tablet (10 mg total) by mouth 3 (three) times daily as needed for muscle spasms. Patient not taking: Reported on 12/02/2017 03/08/15   Danelle Berry, PA-C  fluticasone [facility]) 50 MCG/ACT nasal spray Place 2 sprays into both nostrils daily. Patient not taking: Reported on 12/02/2017 01/21/14   Rodolph Bong, MD  HYDROcodone-acetaminophen (NORCO/VICODIN) 5-325 MG tablet Take 2 tablets by mouth every 4 (four) hours as needed. Patient not taking: Reported on  12/02/2017 03/08/15   Danelle Berry, PA-C  ibuprofen (ADVIL,MOTRIN) 600 MG tablet Take 1 tablet (600 mg total) by mouth every 8 (eight) hours as needed. Patient not taking: Reported on 12/02/2017 03/18/15   Pricilla Loveless, MD  naproxen (NAPROSYN) 500 MG tablet Take 1 tablet (500 mg total) by mouth 2 (two) times daily. Patient not taking: Reported on 12/02/2017 01/15/16   Felicie Morn, NP  omeprazole (PRILOSEC) 40 MG capsule Take 1 capsule (40 mg total) by mouth daily. Patient not taking: Reported on 12/02/2017 01/21/14   Rodolph Bong, MD  penicillin v potassium (VEETID) 500 MG tablet Take 1 tablet (500 mg total) by mouth 3 (three) times daily. Patient not taking: Reported on 12/02/2017 01/15/16   Felicie Morn, NP    Family History No family history on file.  Social History Social History   Tobacco Use  . Smoking status: Current Some Day Smoker    Types: Cigarettes  . Smokeless tobacco: Never Used  Substance Use Topics  . Alcohol use: Yes    Comment: occ  . Drug use: Yes    Types: Marijuana     Allergies   Patient has no known allergies.   Review of Systems Review of Systems Review of Systems   Constitutional  Negative for fever  Negative for chills  HENT  Negative for ear pain  Negative for sore throat  Negative for difficultly swallowing  Eyes  Negative for eye  pain  Negative for visual disturbance  Respiratory  +for shortness of breath  Negative for cough  CV  +for chest pain  Negative for leg swelling  Abdomen  Negative for abdominal pain  Negative for nausea  Negative for vomiting  MSK  Negative for extremity pain  Negative for back pain  Skin  Negative for rash  Negative for wound  Neuro  Negative for syncope  Negative for difficultly speaking  Psych  Negative for confusion   The remainder of the ROS was reviewed and negative except as documented above.      Physical Exam Updated Vital Signs BP (!) 149/98   Pulse (!) 53   Temp  97.9 F (36.6 C) (Oral)   Resp 11   SpO2 100%   Physical Exam Physical Exam Constitutional  Nursing notes reviewed  Vital signs reviewed  HEENT  No obvious trauma  Supple without meningismus, mass, or overt JVD  EOMI  No scleral icterus or injection  Respiratory  Effort normal  CTAB  No respiratory distress  CV  Normal rate  No obvious murmurs  No pitting edema  Equal pulses in all extremities  Chest not tender to palpation  Abdomen  Soft  Non-tender  Non-distended  No peritonitis  MSK  Atraumatic  No obvious deformity  ROM appropriate  Skin  Warm  Dry  Neuro  Awake and alert  EOMI  Moving all extremities  Denies numbness/tingling  Psychiatric  Mood and affect normal        ED Treatments / Results  Labs (all labs ordered are listed, but only abnormal results are displayed) Labs Reviewed  BASIC METABOLIC PANEL - Abnormal; Notable for the following components:      Result Value   Potassium 3.4 (*)    Glucose, Bld 119 (*)    All other components within normal limits  CBC  I-STAT TROPONIN, ED  I-STAT TROPONIN, ED    EKG None  Radiology Dg Chest 2 View  Result Date: 12/02/2017 CLINICAL DATA:  Chest pain EXAM: CHEST - 2 VIEW COMPARISON:  March 18, 2015. FINDINGS: Lungs are clear. Heart size and pulmonary vascularity are normal. No adenopathy. No pneumothorax. No bone lesions. IMPRESSION: No edema or consolidation. Electronically Signed   By: Bretta BangWilliam  Woodruff III M.D.   On: 12/02/2017 15:16    Procedures Procedures (including critical care time)  Medications Ordered in ED Medications - No data to display   Initial Impression / Assessment and Plan / ED Course  I have reviewed the triage vital signs and the nursing notes.  Pertinent labs & imaging results that were available during my care of the patient were reviewed by me and considered in my medical decision making (see chart for details).     Mark Singleton  presents with chest pain as per above.  The ECG reveals no anatomical ischemia representing STEMI, New-Onset Arrhythmia, or ischemic equivalent. He was non-specific TWIs, which were present in 2015. To further evaluate for ongoing myocardial ischemia, serial troponins will be ordered. The first of these is not elevated.  Repeat troponin at 3-hours was also not elevated. Therefore, I do not suspect ACS at this time.   The patient's presentation, the patient being hemodynamically stable, and the ECG are not consistent with Pericardial Tamponade. The patient's pain is not positional. This in conjunction with the lack of PR depressions and ST elevations on the ECG are reassuring against Pericarditis. The patient's non-elevated troponin and ECG are also inconsistent with  Myocarditis.  The CXR is unremarkable for focal airspace disease.  The patient is afebrile and denies productive cough.  Therefore, I do not suspect Pneumonia. There is no evidence of Pneumothorax on physical exam or on the CXR. CXR shows no evidence of Esophageal Tear and there is no recent intractable emesis or esophageal instrumentation. There is no peritonitis or free air on CXR worrisome for a Perforated Abdominal Viscous.  I do not think that the patient has a Pulmonary Embolism. The patient is PERC negative (age < 50, HR < 100, SpO2% > 95%, no unilateral leg swelling, no hemoptysis, no surgery or trauma requiring anesthesia within the past 4 weeks, no history of prior PE or DVT, and no hormone use).   The patient's pain is not described as tearing and it does not radiate to back. Pulses are present bilaterally in both the upper and lower extremities. CXR does not show a widened mediastinum. I have a very low suspicion for Aortic Dissection.  He remained well-appearing on reassessment.  He had no acute abnormalities on his screening labs.  I believe that he is safe for outpatient follow-up.  He agreed to see a PCP.  I provided ED return  precautions.  Final Clinical Impressions(s) / ED Diagnoses   Final diagnoses:  Chest pain, unspecified type    ED Discharge Orders    None       Talitha GivensAshburn, Mark Barrick, MD 12/02/17 Windell [id number]    Blane OharaZavitz, Joshua, MD 12/07/17 1540

## 2017-12-02 NOTE — ED Triage Notes (Signed)
Pt reports cp for the last 5 months. Pt states when the pain comes he gets weak and sob.

## 2017-12-02 NOTE — ED Notes (Signed)
Pt stable, ambulatory, states understanding of discharge instructions 

## 2018-04-02 ENCOUNTER — Encounter (HOSPITAL_COMMUNITY): Payer: Self-pay | Admitting: Emergency Medicine

## 2018-04-02 ENCOUNTER — Ambulatory Visit (HOSPITAL_COMMUNITY)
Admission: EM | Admit: 2018-04-02 | Discharge: 2018-04-02 | Disposition: A | Discharge status: Home / Self Care | Payer: Self-pay | Attending: Emergency Medicine | Admitting: Emergency Medicine

## 2018-04-02 DIAGNOSIS — R0982 Postnasal drip: Secondary | ICD-10-CM | POA: Insufficient documentation

## 2018-04-02 MED ORDER — FLUTICASONE PROPIONATE 50 MCG/ACT NA SUSP: 2.0000 | Freq: Every day | NASAL | 0 refills | Status: DC

## 2018-04-02 NOTE — ED Triage Notes (Addendum)
[facility] for assessment of "lump" feeling in his throat that he has difficulty swallowing down.  States he has constant nasal drainage, and post nasal drip for six months+

## 2018-04-02 NOTE — Discharge Instructions (Addendum)
Claritin, Flonase, saline nasal irrigation with a Lloyd HugerNeil med rinse and distilled water as often as you want.  Follow-up with a primary care provider, see list below.  Below is a list of primary care practices who are taking new patients for you to follow-up with. [facility] [address] [place], [place] [address] [phone]  [facility] [phone] [address] [place] [address]  [facility]: [address] [place] [id number]  [phone]  [facility]: [address] [place] [address]   [phone]  [facility]: [address] [place] [address]  [phone]  [facility] : [address] [place] [address] [phone]  [facility]: [address] [place] [phone]  [facility]: [address] [place] [id number] [phone]  Dr. Oneal GroutMahima Pandey [address] [facility] [place] [id number]  [phone]  Dr. Jackie PlumGeorge Osei-Bonsu, [facility]. [address] [place], [place] [address]  [phone]  Go to www.goodrx.com to look up your medications. This will give you a list of where you can find your prescriptions at the most affordable prices. Or ask the pharmacist what the cash price is, or if they have any other discount programs available to help make your medication more affordable. This can be less expensive than what you would pay with insurance.

## 2018-04-02 NOTE — ED Provider Notes (Signed)
HPI  SUBJECTIVE:  Mark Singleton is a 47 y.o. male who presents with nasal congestion, postnasal drip, rhinorrhea for "months".  He states that it is difficult to swallow his saliva.  States that it gets thicker as he swallows.  He denies a sensation of anything stuck in his throat, no fevers, sore throat.  He is able to tolerate food and drink without any problems.  He also reports some belching, but no burning chest pain or water brash.  No allergy symptoms.  He tried ibuprofen.  He has not tried any antihistamines.  He states symptoms are better when he drinks water, worse with exposure to cold air, as it makes his rhinorrhea worse. he was seen here in October 2015 with globus sensation.  He was treated with omeprazole and Flonase.  States that he never filled these prescriptions.  No history of allergies, GERD, smoking, hypertension.  PMD: None.  History reviewed. No pertinent past medical history.  History reviewed. No pertinent surgical history.  History reviewed. No pertinent family history.  Social History   Tobacco Use  . Smoking status: Current Some Day Smoker    Types: Cigarettes  . Smokeless tobacco: Never Used  Substance Use Topics  . Alcohol use: Yes    Comment: occ  . Drug use: Yes    Types: Marijuana    No current facility-administered medications for this encounter.   Current Outpatient Medications:  .  fluticasone (FLONASE) 50 MCG/ACT nasal spray, Place 2 sprays into both nostrils daily., Disp: 16 g, Rfl: 0 .  omeprazole (PRILOSEC) 40 MG capsule, Take 1 capsule (40 mg total) by mouth daily. (Patient not taking: Reported on 12/02/2017), Disp: 30 capsule, Rfl: 1  No Known Allergies   ROS  As noted in HPI.   Physical Exam  BP 121/81 (BP Location: Right Arm)   Pulse 71   Temp 98.2 F (36.8 C) (Oral)   Resp 16   SpO2 97%   Constitutional: Well developed, well nourished, no acute distress.  Voice normal.  No drooling, trismus. Eyes:  EOMI, conjunctiva  normal bilaterally HENT: Normocephalic, atraumatic,mucus membranes moist.  Mild nasal congestion.  Erythematous, swollen turbinates.  No sinus tenderness.  Positive postnasal drip and cobblestoning. Respiratory: Normal inspiratory effort Cardiovascular: Normal rate GI: nondistended skin: No rash, skin intact Musculoskeletal: no deformities Neurologic: Alert & oriented x 3, no focal neuro deficits Psychiatric: Speech and behavior appropriate   ED Course   Medications - No data to display  No orders of the defined types were placed in this encounter.   No results found for this or any previous visit (from the past 24 hour(s)). No results found.  ED Clinical Impression  Postnasal drip   ED Assessment/Plan  Previous records reviewed.  As noted in HPI.  Suspect that this is allergies given duration of symptoms, will start him on Claritin, Flonase, saline nasal irrigation with a Lloyd Huger med rinse and distilled water as often as he wants.  GERD is also in the differential.  Will provide a primary care referral list for ongoing care.  Discussed medical decision making, treatment plan and plan for follow-up with patient.  Patient agrees with plan.   Meds ordered this encounter  Medications  . fluticasone (FLONASE) 50 MCG/ACT nasal spray    Sig: Place 2 sprays into both nostrils daily.    Dispense:  16 g    Refill:  0    *This clinic note was created using Scientist, clinical (histocompatibility and immunogenetics). Therefore, there may  be occasional mistakes despite careful proofreading.   ?    Domenick GongMortenson, Norman Piacentini, MD 04/03/18 1526

## 2018-09-30 ENCOUNTER — Encounter (HOSPITAL_COMMUNITY): Payer: Self-pay

## 2018-09-30 ENCOUNTER — Ambulatory Visit (HOSPITAL_COMMUNITY)
Admission: EM | Admit: 2018-09-30 | Discharge: 2018-09-30 | Disposition: A | Discharge status: Home / Self Care | Payer: Self-pay | Attending: Family Medicine | Admitting: Family Medicine

## 2018-09-30 ENCOUNTER — Other Ambulatory Visit: Payer: Self-pay

## 2018-09-30 DIAGNOSIS — H01004 Unspecified blepharitis left upper eyelid: Secondary | ICD-10-CM

## 2018-09-30 MED ORDER — ERYTHROMYCIN 5 MG/GM OP OINT: 1.0000 "application " | TOPICAL_OINTMENT | Freq: Four times a day (QID) | OPHTHALMIC | 0 refills | Status: AC

## 2018-09-30 MED ORDER — SULFAMETHOXAZOLE-TRIMETHOPRIM 800-160 MG PO TABS: 1.0000 | ORAL_TABLET | Freq: Two times a day (BID) | ORAL | 0 refills | Status: AC

## 2018-09-30 NOTE — Discharge Instructions (Signed)
Complete course of antibiotics.  Lid scrubs and warm compresses.  Follow up with eye doctor for any worsening or persistent symptoms.

## 2018-09-30 NOTE — ED Triage Notes (Signed)
Pt cc has left eye drainage and swelling. Pt state the eye lid is very painful. X 3 days.

## 2018-09-30 NOTE — ED Notes (Signed)
Patient able to ambulate independently  

## 2018-09-30 NOTE — ED Provider Notes (Signed)
[facility]    CSN: [id number] Arrival date & time: 09/30/18  1829     History   Chief Complaint Chief Complaint  Patient presents with  . Conjunctivitis    HPI Mark Singleton is a 48 y.o. male.   Mark Singleton presents with complaints of redness swelling and drainage to left upper eye lid. Started three days ago and has worsened. Eye is tearing. No eye ball pain. No fevers. His vision feels blurred related to drainage. Denies any previous similar. Has woken with mattering. Hasn't tried any treatments or medications for symptoms. No known exposures or foreign body risk. Doesn't wear contacts or glasses. Without contributing medical history.      ROS per HPI, negative if not otherwise mentioned.      History reviewed. No pertinent past medical history.  There are no active problems to display for this patient.   History reviewed. No pertinent surgical history.     Home Medications    Prior to Admission medications   Medication Sig Start Date End Date Taking? Authorizing Provider  erythromycin ophthalmic ointment Place 1 application into the left eye 4 (four) times daily for 7 days. 09/30/18 10/07/18  Georgetta HaberBurky,  B, NP  sulfamethoxazole-trimethoprim (BACTRIM DS) 800-160 MG tablet Take 1 tablet by mouth 2 (two) times daily for 7 days. 09/30/18 10/07/18  Georgetta HaberBurky,  B, NP    Family History History reviewed. No pertinent family history.  Social History Social History   Tobacco Use  . Smoking status: Current Some Day Smoker    Types: Cigarettes  . Smokeless tobacco: Never Used  Substance Use Topics  . Alcohol use: Yes    Comment: occ  . Drug use: Yes    Types: Marijuana     Allergies   Patient has no known allergies.   Review of Systems Review of Systems   Physical Exam Triage Vital Signs ED Triage Vitals  Enc Vitals Group     BP 09/30/18 1857 129/68     Pulse --      Resp 09/30/18 1857 18     Temp 09/30/18 1857 98.6 F (37  C)     Temp Source 09/30/18 1857 Oral     SpO2 09/30/18 1857 100 %     Weight 09/30/18 1855 155 lb (70.3 kg)     Height --      Head Circumference --      Peak Flow --      Pain Score 09/30/18 1855 6     Pain Loc --      Pain Edu? --      Excl. in [place]? --    No data found.  Updated Vital Signs BP 129/68 (BP Location: Right Arm)   Temp 98.6 F (37 C) (Oral)   Resp 18   Wt 155 lb (70.3 kg)   SpO2 100%   BMI 24.28 kg/m   Visual Acuity Right Eye Distance:   Left Eye Distance:   Bilateral Distance:    Right Eye Near: R Near: 20/20 Left Eye Near:  L Near: 20/30 Bilateral Near:  20/20  Physical Exam Constitutional:      Appearance: He is well-developed.  Eyes:     Extraocular Movements: Extraocular movements intact.     Conjunctiva/sclera: Conjunctivae normal.     Comments: Left upper eye lid red swollen and with 2-3 small pustules to lateral upper eye lid at lash line; clear tearing noted; non tender   Cardiovascular:  Rate and Rhythm: Normal rate.  Pulmonary:     Effort: Pulmonary effort is normal.  Skin:    General: Skin is warm and dry.  Neurological:     Mental Status: He is alert and oriented to person, place, and time.      UC Treatments / Results  Labs (all labs ordered are listed, but only abnormal results are displayed) Labs Reviewed - No data to display  EKG None  Radiology No results found.  Procedures Procedures (including critical care time)  Medications Ordered in UC Medications - No data to display  Initial Impression / Assessment and Plan / UC Course  I have reviewed the triage vital signs and the nursing notes.  Pertinent labs & imaging results that were available during my care of the patient were reviewed by me and considered in my medical decision making (see chart for details).     Blepharitis vs stye vs both. Quite significant redness and pustules at lash line, opted to cover with oral and topical antibiotics. Return  precautions provided. Patient verbalized understanding and agreeable to plan.   Final Clinical Impressions(s) / UC Diagnoses   Final diagnoses:  Blepharitis of left upper eyelid, unspecified type     Discharge Instructions     Complete course of antibiotics.  Lid scrubs and warm compresses.  Follow up with eye doctor for any worsening or persistent symptoms.     ED Prescriptions    Medication Sig Dispense Auth. Provider   sulfamethoxazole-trimethoprim (BACTRIM DS) 800-160 MG tablet Take 1 tablet by mouth 2 (two) times daily for 7 days. 14 tablet Augusto Gamble B, NP   erythromycin ophthalmic ointment Place 1 application into the left eye 4 (four) times daily for 7 days. 28 g Augusto Gamble B, NP     Controlled Substance Prescriptions [place] Controlled Substance Registry consulted? Not Applicable   Zigmund Gottron, NP 09/30/18 1950

## 2018-11-12 IMAGING — DX DG CHEST 2V
2 series · 2 of 2 positions shown · non-contrast
Comparison: March 18, 2015.

CLINICAL DATA: Chest pain

EXAM:
CHEST - 2 VIEW

[w chest pa]
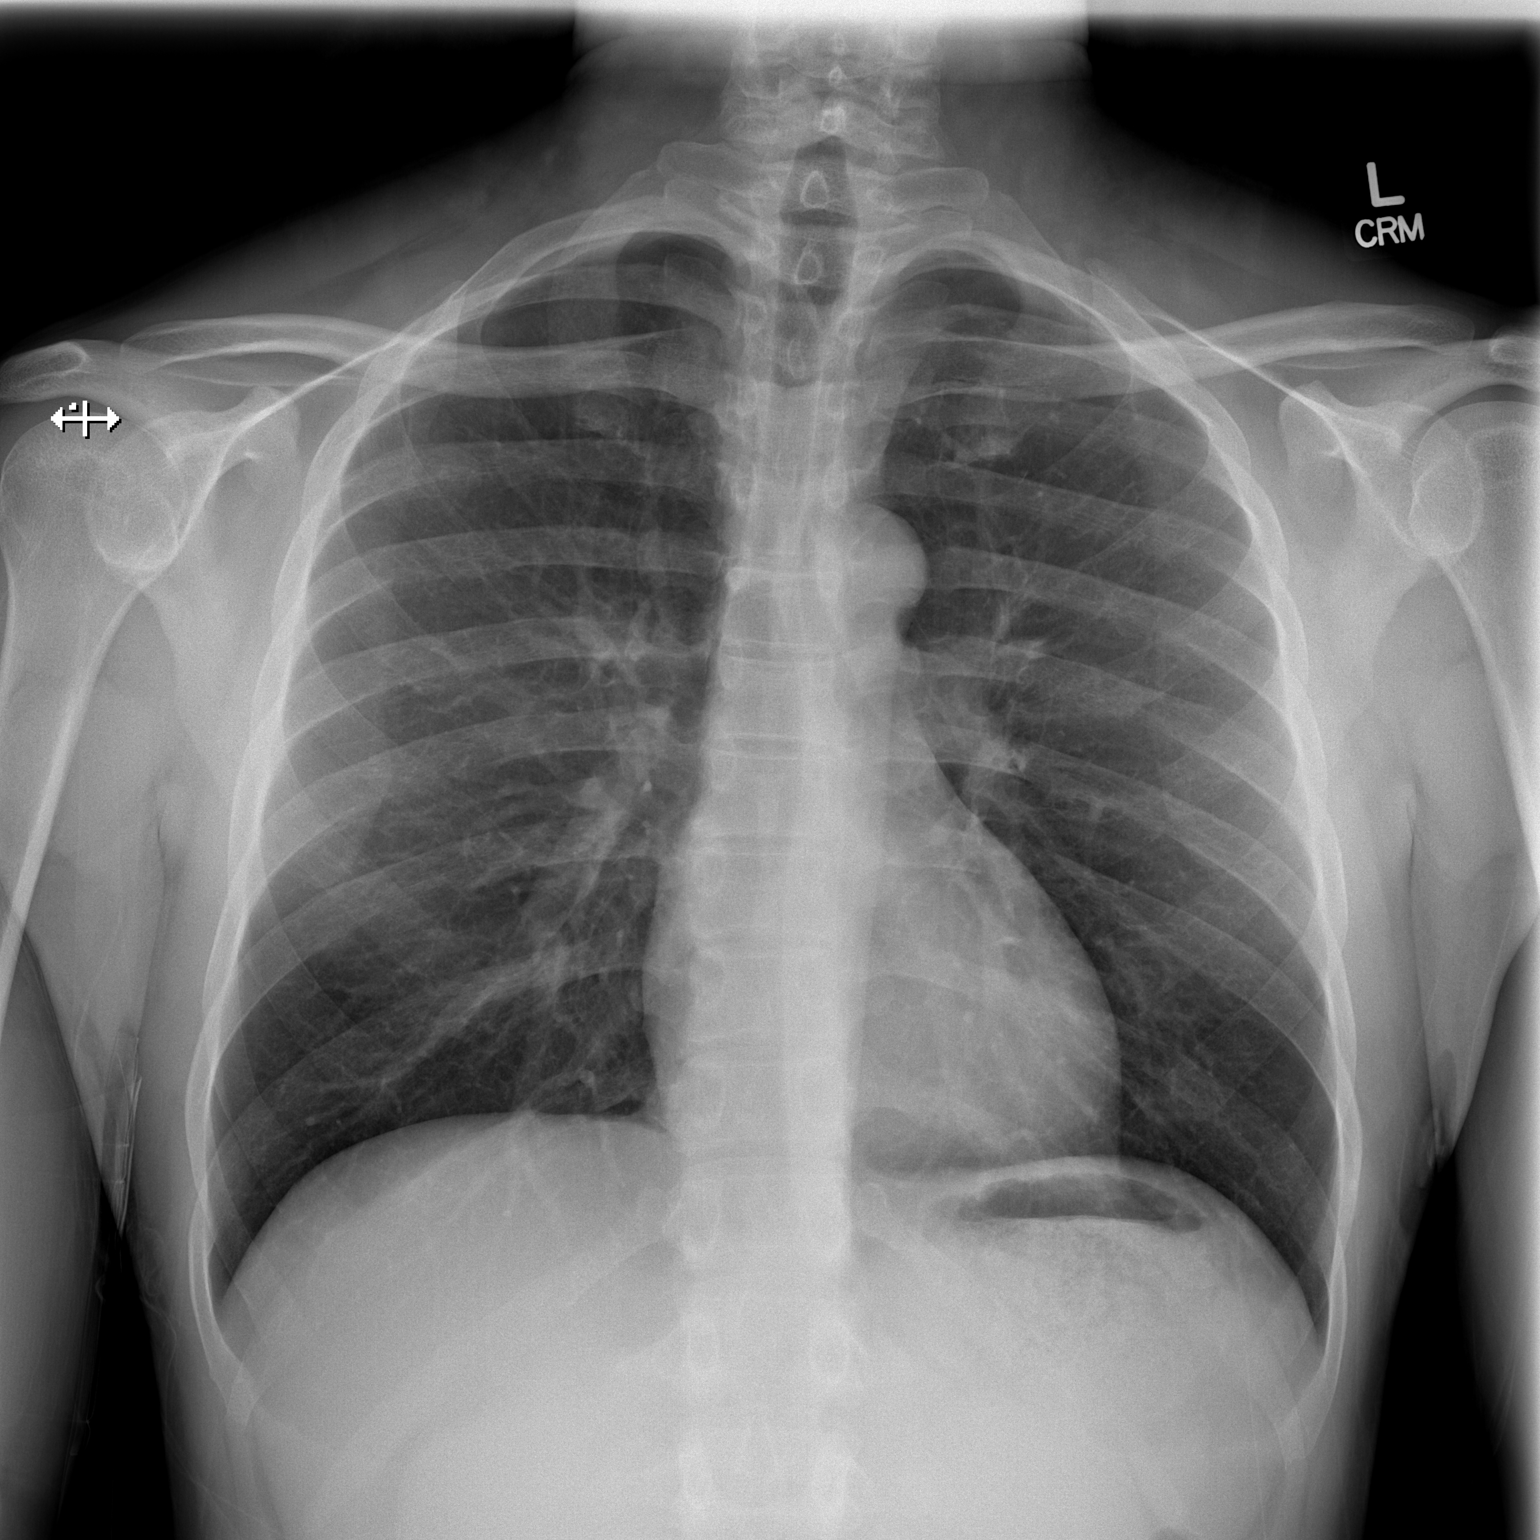

[w chest lat]
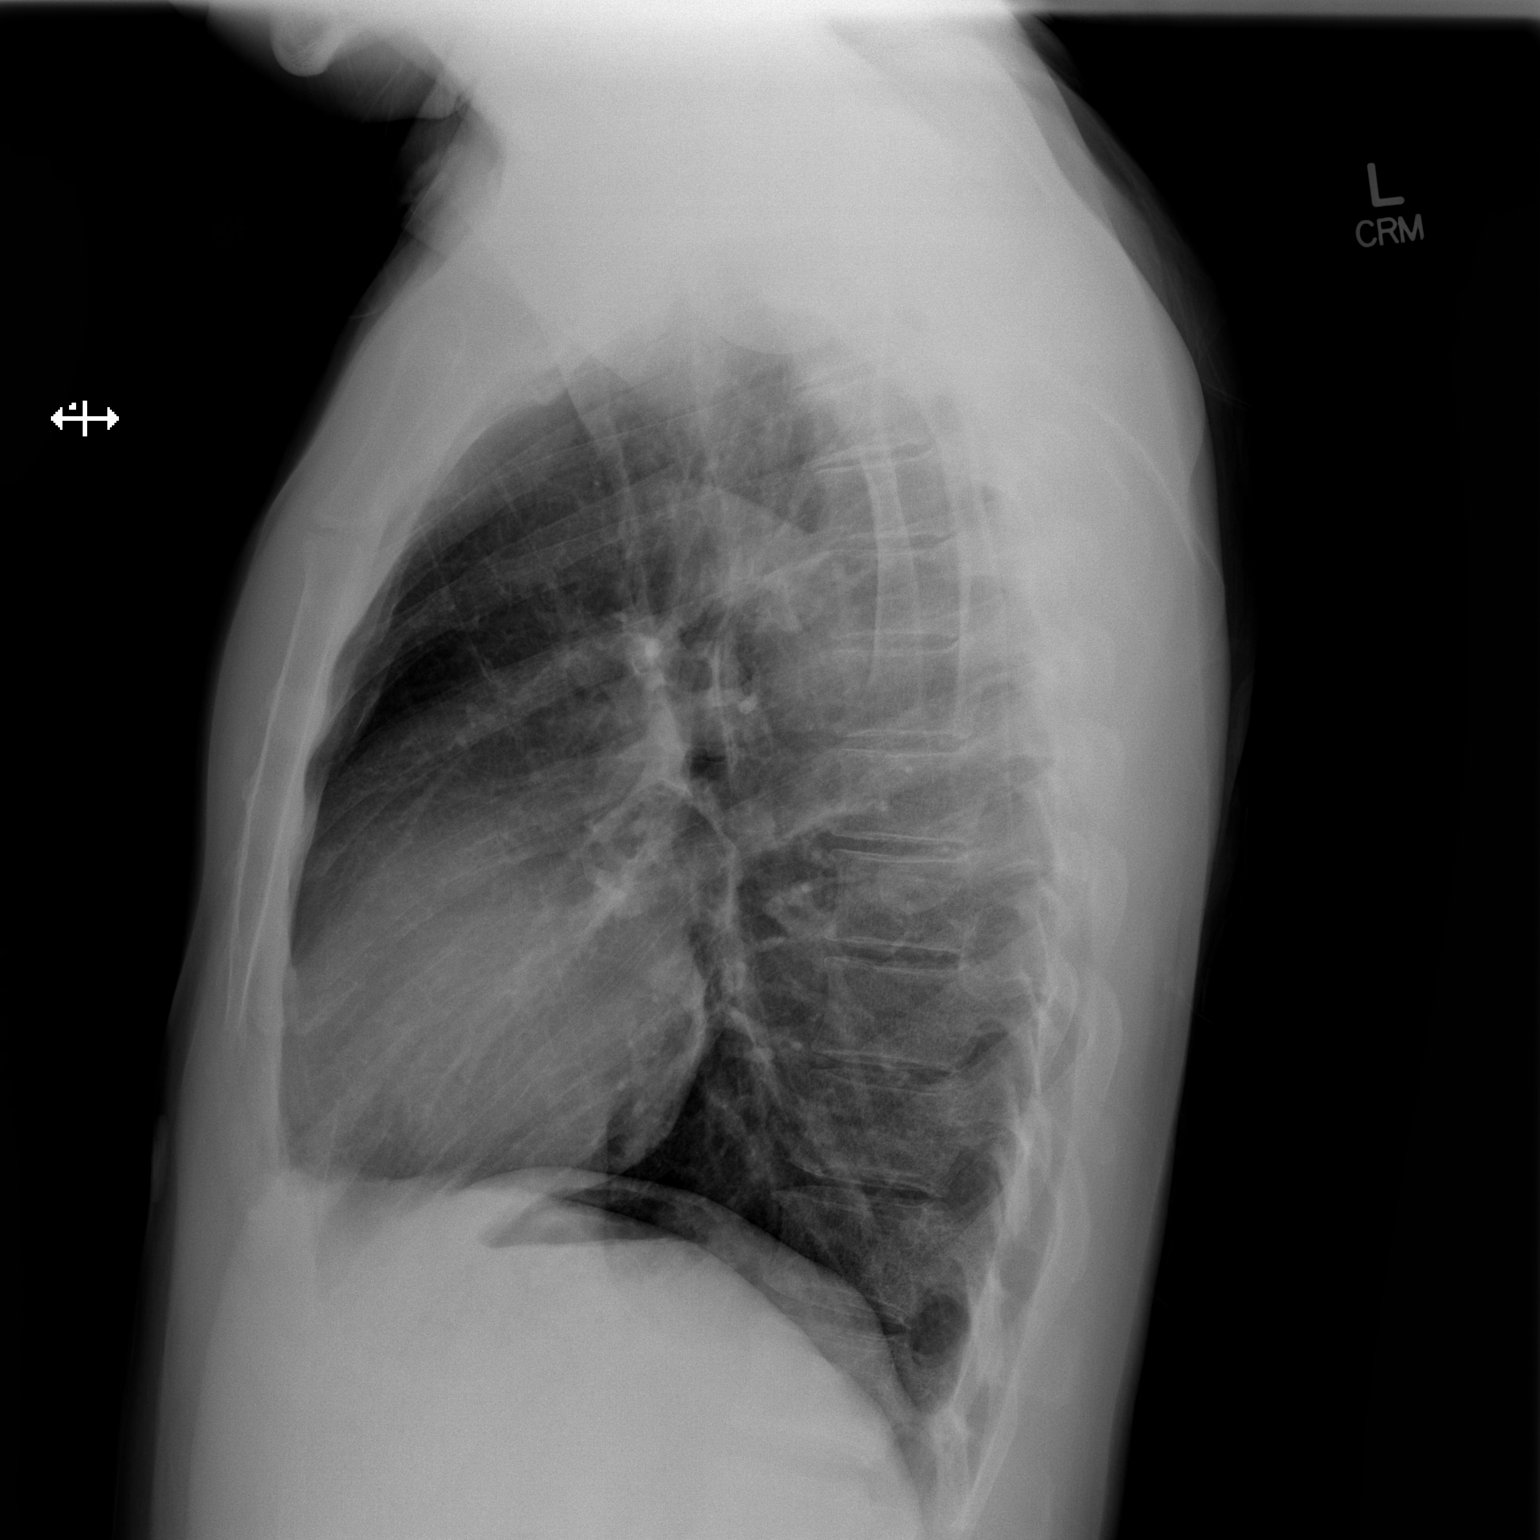

[2 of 2 positions shown; findings below may reference images not displayed]

FINDINGS: Lungs are clear. Heart size and pulmonary vascularity are normal. No
adenopathy. No pneumothorax. No bone lesions.
IMPRESSION: No edema or consolidation.

## 2019-02-02 ENCOUNTER — Ambulatory Visit (HOSPITAL_COMMUNITY)
Admission: EM | Admit: 2019-02-02 | Discharge: 2019-02-02 | Disposition: A | Discharge status: Home / Self Care | Payer: Self-pay | Attending: Family Medicine | Admitting: Family Medicine

## 2019-02-02 ENCOUNTER — Encounter (HOSPITAL_COMMUNITY): Payer: Self-pay

## 2019-02-02 ENCOUNTER — Other Ambulatory Visit: Payer: Self-pay

## 2019-02-02 DIAGNOSIS — Z20828 Contact with and (suspected) exposure to other viral communicable diseases: Secondary | ICD-10-CM | POA: Insufficient documentation

## 2019-02-02 DIAGNOSIS — Z20822 Contact with and (suspected) exposure to covid-19: Secondary | ICD-10-CM

## 2019-02-02 NOTE — Discharge Instructions (Signed)
You can check your MyChart for results Follow up as needed for continued or worsening symptoms

## 2019-02-02 NOTE — ED Provider Notes (Signed)
[facility]    CSN: [id number] Arrival date & time: 02/02/19  1007      History   Chief Complaint Chief Complaint  Patient presents with  . covid test    HPI Mark Singleton is a 48 y.o. male.   Patient is a 48 year old male that is otherwise healthy.  He presents today for COVID testing.  He would like to be tested he can return to work.  Patient states that he was inside of a home of someone that was COVID positive.  He is currently having no symptoms.     History reviewed. No pertinent past medical history.  There are no active problems to display for this patient.   History reviewed. No pertinent surgical history.     Home Medications    Prior to Admission medications   Not on File    Family History History reviewed. No pertinent family history.  Social History Social History   Tobacco Use  . Smoking status: Current Some Day Smoker    Types: Cigarettes  . Smokeless tobacco: Never Used  Substance Use Topics  . Alcohol use: Yes    Comment: occ  . Drug use: Yes    Types: Marijuana     Allergies   Patient has no known allergies.   Review of Systems Review of Systems  Constitutional: Negative for chills and fever.  HENT: Negative for congestion, ear discharge, ear pain, postnasal drip, rhinorrhea, sinus pressure, sinus pain, sneezing and sore throat.   Respiratory: Negative for cough, chest tightness, shortness of breath and wheezing.   Cardiovascular: Negative for chest pain.  Gastrointestinal: Negative for diarrhea and nausea.     Physical Exam Triage Vital Signs ED Triage Vitals  Enc Vitals Group     BP 02/02/19 1052 132/81     Pulse Rate 02/02/19 1052 62     Resp 02/02/19 1052 16     Temp 02/02/19 1052 98.5 F (36.9 C)     Temp Source 02/02/19 1052 Oral     SpO2 02/02/19 1052 98 %     Weight 02/02/19 1053 153 lb (69.4 kg)     Height --      Head Circumference --      Peak Flow --      Pain Score 02/02/19 1053 6    Pain Loc --      Pain Edu? --      Excl. in [place]? --    No data found.  Updated Vital Signs BP 132/81 (BP Location: Right Arm)   Pulse 62   Temp 98.5 F (36.9 C) (Oral)   Resp 16   Wt 153 lb (69.4 kg)   SpO2 98%   BMI 23.96 kg/m   Visual Acuity Right Eye Distance:   Left Eye Distance:   Bilateral Distance:    Right Eye Near:   Left Eye Near:    Bilateral Near:     Physical Exam Vitals signs and nursing note reviewed.  Constitutional:      Appearance: Normal appearance.  HENT:     Head: Normocephalic and atraumatic.     Nose: Nose normal.  Eyes:     Conjunctiva/sclera: Conjunctivae normal.  Neck:     Musculoskeletal: Normal range of motion.  Pulmonary:     Effort: Pulmonary effort is normal.  Musculoskeletal: Normal range of motion.  Skin:    General: Skin is warm and dry.  Neurological:     Mental Status: He is alert.  Psychiatric:        Mood and Affect: Mood normal.      UC Treatments / Results  Labs (all labs ordered are listed, but only abnormal results are displayed) Labs Reviewed  NOVEL CORONAVIRUS, NAA (HOSP ORDER, SEND-OUT TO REF LAB; TAT 18-24 HRS)    EKG   Radiology No results found.  Procedures Procedures (including critical care time)  Medications Ordered in UC Medications - No data to display  Initial Impression / Assessment and Plan / UC Course  I have reviewed the triage vital signs and the nursing notes.  Pertinent labs & imaging results that were available during my care of the patient were reviewed by me and considered in my medical decision making (see chart for details).     COVID testing due to exposure Currently asymptomatic. Precautions given.  Recommended checking MyChart for results Final Clinical Impressions(s) / UC Diagnoses   Final diagnoses:  Close exposure to COVID-19 virus     Discharge Instructions     You can check your MyChart for results Follow up as needed for continued or worsening symptoms      ED Prescriptions    None     PDMP not reviewed this encounter.   Janace Aris, NP 02/02/19 1214

## 2019-02-02 NOTE — ED Triage Notes (Signed)
Pt states she needs a Covid test so that he can return to work. Pt states he was in the home of someone that had Covid.

## 2019-02-03 LAB — NOVEL CORONAVIRUS, NAA (HOSP ORDER, SEND-OUT TO REF LAB; TAT 18-24 HRS): SARS-CoV-2, NAA: NOT DETECTED

## 2019-10-11 ENCOUNTER — Other Ambulatory Visit: Payer: Self-pay

## 2019-10-11 ENCOUNTER — Ambulatory Visit (HOSPITAL_COMMUNITY)
Admission: EM | Admit: 2019-10-11 | Discharge: 2019-10-11 | Disposition: A | Discharge status: Home / Self Care | Payer: Self-pay | Attending: Family Medicine | Admitting: Family Medicine

## 2019-10-11 ENCOUNTER — Encounter (HOSPITAL_COMMUNITY): Payer: Self-pay

## 2019-10-11 DIAGNOSIS — J039 Acute tonsillitis, unspecified: Secondary | ICD-10-CM | POA: Insufficient documentation

## 2019-10-11 DIAGNOSIS — J029 Acute pharyngitis, unspecified: Secondary | ICD-10-CM | POA: Insufficient documentation

## 2019-10-11 LAB — POCT RAPID STREP A: Streptococcus, Group A Screen (Direct): NEGATIVE

## 2019-10-11 MED ORDER — IBUPROFEN 800 MG PO TABS: 800.0000 mg | ORAL_TABLET | Freq: Three times a day (TID) | ORAL | 0 refills | Status: AC

## 2019-10-11 MED ORDER — CETIRIZINE HCL 10 MG PO CAPS: 10.0000 mg | ORAL_CAPSULE | Freq: Every day | ORAL | 0 refills | Status: AC

## 2019-10-11 MED ORDER — AMOXICILLIN 500 MG PO CAPS: 500.0000 mg | ORAL_CAPSULE | Freq: Two times a day (BID) | ORAL | 0 refills | Status: AC

## 2019-10-11 NOTE — ED Triage Notes (Signed)
Pt reports sore throat x 2 days, painful when swallowing. Pt denies fever or any other symptoms.Marland Kitchen

## 2019-10-11 NOTE — Discharge Instructions (Signed)
Sore Throat  Strep negative, I am treating you for tonsillitis- begin amoxicillin twice daily x 10 days  Please continue Tylenol or Ibuprofen for fever and pain. May try salt water gargles, cepacol lozenges, throat spray, or OTC cold relief medicine for throat discomfort. If you also have congestion take a daily anti-histamine like Zyrtec, Claritin, and a oral decongestant to help with post nasal drip that may be irritating your throat.   Stay hydrated and drink plenty of fluids to keep your throat coated relieve irritation.

## 2019-10-12 NOTE — ED Provider Notes (Signed)
[facility]    CSN: [id number] Arrival date & time: 10/11/19  1834      History   Chief Complaint Chief Complaint  Patient presents with  . Sore Throat    HPI Mark Singleton is a 49 y.o. male no significant past medical history presenting today for evaluation of sore throat.  Patient has had a sore throat for approximately 2 days.  Pain worse on the right side.  He denies any associated rhinorrhea.  Does admit to some mucus noted in throat.  Denies cough.  Denies fevers.  Denies close sick contacts.  HPI  History reviewed. No pertinent past medical history.  There are no problems to display for this patient.   History reviewed. No pertinent surgical history.     Home Medications    Prior to Admission medications   Medication Sig Start Date End Date Taking? Authorizing Provider  amoxicillin (AMOXIL) 500 MG capsule Take 1 capsule (500 mg total) by mouth 2 (two) times daily for 10 days. 10/11/19 10/21/19  Thao Vanover C, PA-C  Cetirizine HCl 10 MG CAPS Take 1 capsule (10 mg total) by mouth daily. 10/11/19   Ysabela Keisler C, PA-C  ibuprofen (ADVIL) 800 MG tablet Take 1 tablet (800 mg total) by mouth 3 (three) times daily. 10/11/19   Bert Givans, Elesa Hacker, PA-C    Family History History reviewed. No pertinent family history.  Social History Social History   Tobacco Use  . Smoking status: Current Some Day Smoker    Types: Cigarettes  . Smokeless tobacco: Never Used  Substance Use Topics  . Alcohol use: Yes    Comment: occ  . Drug use: Yes    Types: Marijuana     Allergies   Patient has no known allergies.   Review of Systems Review of Systems  Constitutional: Negative for activity change, appetite change, chills, fatigue and fever.  HENT: Positive for sore throat. Negative for congestion, ear pain, rhinorrhea, sinus pressure and trouble swallowing.   Eyes: Negative for discharge and redness.  Respiratory: Negative for cough, chest tightness and  shortness of breath.   Cardiovascular: Negative for chest pain.  Gastrointestinal: Negative for abdominal pain, diarrhea, nausea and vomiting.  Musculoskeletal: Negative for myalgias.  Skin: Negative for rash.  Neurological: Negative for dizziness, light-headedness and headaches.     Physical Exam Triage Vital Signs ED Triage Vitals  Enc Vitals Group     BP 10/11/19 2023 (!) 142/93     Pulse Rate 10/11/19 2023 63     Resp 10/11/19 2023 16     Temp 10/11/19 2023 99.5 F (37.5 C)     Temp Source 10/11/19 2023 Oral     SpO2 10/11/19 2023 100 %     Weight --      Height --      Head Circumference --      Peak Flow --      Pain Score 10/11/19 2022 10     Pain Loc --      Pain Edu? --      Excl. in [place]? --    No data found.  Updated Vital Signs BP (!) 142/93 (BP Location: Left Arm)   Pulse 63   Temp 99.5 F (37.5 C) (Oral)   Resp 16   SpO2 100%   Visual Acuity Right Eye Distance:   Left Eye Distance:   Bilateral Distance:    Right Eye Near:   Left Eye Near:    Bilateral Near:  Physical Exam Vitals and nursing note reviewed.  Constitutional:      Appearance: He is well-developed.     Comments: No acute distress  HENT:     Head: Normocephalic and atraumatic.     Ears:     Comments: bilalteral cerumen impaction    Nose: Nose normal.     Mouth/Throat:     Comments: Right tonsil moderately enlarged, slightly larger than left, small area of increased erythema and swelling noted to superior aspect of tonsil, small amount of exudate, posterior pharynx patent, uvula midline, no soft palate swelling Eyes:     Conjunctiva/sclera: Conjunctivae normal.  Cardiovascular:     Rate and Rhythm: Normal rate.  Pulmonary:     Effort: Pulmonary effort is normal. No respiratory distress.     Comments: Breathing comfortably at rest, CTABL, no wheezing, rales or other adventitious sounds auscultated Abdominal:     General: There is no distension.  Musculoskeletal:         General: Normal range of motion.     Cervical back: Neck supple.  Skin:    General: Skin is warm and dry.  Neurological:     Mental Status: He is alert and oriented to person, place, and time.      UC Treatments / Results  Labs (all labs ordered are listed, but only abnormal results are displayed) Labs Reviewed  CULTURE, GROUP A STREP [facility])  POCT RAPID STREP A    EKG   Radiology No results found.  Procedures Procedures (including critical care time)  Medications Ordered in UC Medications - No data to display  Initial Impression / Assessment and Plan / UC Course  I have reviewed the triage vital signs and the nursing notes.  Pertinent labs & imaging results that were available during my care of the patient were reviewed by me and considered in my medical decision making (see chart for details).     Strep test negative, given appearance of right tonsil we will go ahead and cover for tonsillitis versus early peritonsillar abscess with amoxicillin twice daily x10 days.  Also discussed possibility of underlying postnasal drainage contributing to symptoms.  Tylenol and ibuprofen for pain.  Discussed strict return precautions. Patient verbalized understanding and is agreeable with plan.  Final Clinical Impressions(s) / UC Diagnoses   Final diagnoses:  Tonsillitis  Sore throat     Discharge Instructions     Sore Throat  Strep negative, I am treating you for tonsillitis- begin amoxicillin twice daily x 10 days  Please continue Tylenol or Ibuprofen for fever and pain. May try salt water gargles, cepacol lozenges, throat spray, or OTC cold relief medicine for throat discomfort. If you also have congestion take a daily anti-histamine like Zyrtec, Claritin, and a oral decongestant to help with post nasal drip that may be irritating your throat.   Stay hydrated and drink plenty of fluids to keep your throat coated relieve irritation.    ED Prescriptions    Medication  Sig Dispense Auth. Provider   ibuprofen (ADVIL) 800 MG tablet Take 1 tablet (800 mg total) by mouth 3 (three) times daily. 21 tablet Abhijay Morriss C, PA-C   amoxicillin (AMOXIL) 500 MG capsule Take 1 capsule (500 mg total) by mouth 2 (two) times daily for 10 days. 20 capsule Tyesha Joffe C, PA-C   Cetirizine HCl 10 MG CAPS Take 1 capsule (10 mg total) by mouth daily. 15 capsule Sirus Labrie, [place] C, PA-C     PDMP not reviewed this encounter.  Lew Dawes, PA-C 10/12/19 1104

## 2019-10-14 LAB — CULTURE, GROUP A STREP (THRC)

## 2020-10-05 ENCOUNTER — Ambulatory Visit (INDEPENDENT_AMBULATORY_CARE_PROVIDER_SITE_OTHER): Payer: BC Managed Care – PPO | Admitting: Otolaryngology

## 2020-10-05 ENCOUNTER — Other Ambulatory Visit: Payer: Self-pay

## 2020-10-05 VITALS — Temp 97.0°F

## 2020-10-05 DIAGNOSIS — K219 Gastro-esophageal reflux disease without esophagitis: Secondary | ICD-10-CM

## 2020-10-05 DIAGNOSIS — J310 Chronic rhinitis: Secondary | ICD-10-CM | POA: Diagnosis not present

## 2020-10-05 NOTE — Progress Notes (Signed)
HPI: Mark Singleton is a 50 y.o. male who presents for evaluation of complaints of chronic postnasal drainage as well as thick mucus in his throat that he has to clear a lot.  He has occasional difficulty swallowing.  He has had previous upper endoscopy with GI that was clear. He states that his nose runs more when he is in a cold environment.  But he complains of thick postnasal drainage with a lot of congestion more in the throat. He has had previous history of a right tonsil abscess about a year ago.  He has not had any trouble breathing through his nose presently but has occasional difficulty breathing through his nose.  He has tried xylitol without much benefit. He occasionally uses Vicks for his nose.  No past medical history on file. No past surgical history on file. Social History   Socioeconomic History   Marital status: Single    Spouse name: Not on file   Number of children: Not on file   Years of education: Not on file   Highest education level: Not on file  Occupational History   Not on file  Tobacco Use   Smoking status: Some Days    Pack years: 0.00    Types: Cigarettes   Smokeless tobacco: Never  Substance and Sexual Activity   Alcohol use: Yes    Comment: occ   Drug use: Yes    Types: Marijuana   Sexual activity: Not on file  Other Topics Concern   Not on file  Social History Narrative   Not on file   Social Determinants of Health   Financial Resource Strain: Not on file  Food Insecurity: Not on file  Transportation Needs: Not on file  Physical Activity: Not on file  Stress: Not on file  Social Connections: Not on file   No family history on file. No Known Allergies Prior to Admission medications   Medication Sig Start Date End Date Taking? Authorizing Provider  Cetirizine HCl 10 MG CAPS Take 1 capsule (10 mg total) by mouth daily. 10/11/19   Wieters, Hallie C, PA-C  ibuprofen (ADVIL) 800 MG tablet Take 1 tablet (800 mg total) by mouth 3 (three) times  daily. 10/11/19   Wieters, Hallie C, PA-C     Positive ROS: Otherwise negative  All other systems have been reviewed and were otherwise negative with the exception of those mentioned in the HPI and as above.  Physical Exam: Constitutional: Alert, well-appearing, no acute distress Ears: External ears without lesions or tenderness. Ear canals are clear bilaterally with intact, clear TMs.  Nasal: External nose without lesions. Septum is relatively midline with mild rhinitis.  Both middle meatus regions are clear.  No polyps noted..  On nasal endoscopy both middle meatus regions were clear with no edema no mucopurulent discharge.  Posterior nasal cavity is clear.  Mucus within the nasal cavity is clear.  The nasopharynx is clear.  The fiberoptic laryngoscope was passed down through the nasopharynx in the epiglottis vocal cords were clear.  Of note he has slightly enlarged right tonsil.  But no hypopharyngeal or laryngeal abnormality noted. Oral: Lips and gums without lesions. Tongue and palate mucosa without lesions. Posterior oropharynx clear.  Right tonsil slightly larger than left tonsil but appear benign bilaterally. Neck: No palpable adenopathy or masses.  No palpable adenopathy in the neck. Respiratory: Breathing comfortably  Skin: No facial/neck lesions or rash noted.  Procedures  Assessment: Chronic rhinitis Globus type symptoms may be related to  GE reflux disease.  Plan: Prescribed Nasacort 2 sprays each nostril at night. Also prescribed omeprazole 40 mg daily before dinner for the next 2 months. Suggested drinking plenty of water as he complains of thick mucus as well as use of saline nasal rinses and reviewed with him concerning options of saline nasal rinses. Suggested trying Claritin or Allegra and comparing this to Xyzal. Reassured him of no signs of infection or structural abnormality.  Narda Bonds, MD

## 2021-06-11 ENCOUNTER — Encounter (HOSPITAL_BASED_OUTPATIENT_CLINIC_OR_DEPARTMENT_OTHER): Payer: Self-pay

## 2021-06-11 ENCOUNTER — Emergency Department (HOSPITAL_BASED_OUTPATIENT_CLINIC_OR_DEPARTMENT_OTHER)
Admission: EM | Admit: 2021-06-11 | Discharge: 2021-06-11 | Disposition: A | Discharge status: Home / Self Care | Payer: BC Managed Care – PPO | Attending: Emergency Medicine | Admitting: Emergency Medicine

## 2021-06-11 ENCOUNTER — Emergency Department (HOSPITAL_BASED_OUTPATIENT_CLINIC_OR_DEPARTMENT_OTHER): Payer: BC Managed Care – PPO | Admitting: Radiology

## 2021-06-11 ENCOUNTER — Other Ambulatory Visit: Payer: Self-pay

## 2021-06-11 DIAGNOSIS — S99921A Unspecified injury of right foot, initial encounter: Secondary | ICD-10-CM | POA: Insufficient documentation

## 2021-06-11 DIAGNOSIS — X58XXXA Exposure to other specified factors, initial encounter: Secondary | ICD-10-CM | POA: Insufficient documentation

## 2021-06-11 NOTE — Discharge Instructions (Signed)
Your x-ray here today did not show any evidence of broken bones.  This is likely soft tissue injury.  You may take Tylenol and ibuprofen as needed for pain.  Make sure to ice and elevate the leg.  Follow-up with primary care provider if your symptoms do not improve

## 2021-06-11 NOTE — ED Provider Notes (Signed)
[facility] EMERGENCY DEPT Provider Note   CSN: [id number] Arrival date & time: 06/11/21  1131     History  Chief Complaint  Patient presents with   Toe Injury    Big toe    Mark Singleton is a 51 y.o. male with no significant past medical history here for evaluation of great toe pain.  Kicked a ball yesterday with sandals on.  Had pain to the distal aspect of his right great toe.  He feels like his toe is swollen.  Did state he had a prior fractured great toe a few years ago.  He denies any paresthesias, weakness, redness, warmth, nailbed injury.  No meds PTA.  HPI     Home Medications Prior to Admission medications   Medication Sig Start Date End Date Taking? Authorizing Provider  Cetirizine HCl 10 MG CAPS Take 1 capsule (10 mg total) by mouth daily. 10/11/19   Wieters, Hallie C, PA-C  ibuprofen (ADVIL) 800 MG tablet Take 1 tablet (800 mg total) by mouth 3 (three) times daily. 10/11/19   Wieters, Hallie C, PA-C      Allergies    Patient has no known allergies.    Review of Systems   Review of Systems  Constitutional: Negative.   HENT: Negative.    Respiratory: Negative.    Cardiovascular: Negative.   Gastrointestinal: Negative.   Genitourinary: Negative.   Musculoskeletal:        Right great toe pain  Skin: Negative.   Neurological: Negative.   All other systems reviewed and are negative.  Physical Exam Updated Vital Signs BP (!) 126/97    Pulse 66    Temp 98 F (36.7 C)    Resp 16    Ht 5\' 7"  (1.702 m)    Wt 70.3 kg    SpO2 100%    BMI 24.28 kg/m  Physical Exam Vitals and nursing note reviewed.  Constitutional:      General: He is not in acute distress.    Appearance: He is well-developed. He is not ill-appearing, toxic-appearing or diaphoretic.  HENT:     Head: Normocephalic and atraumatic.  Eyes:     Pupils: Pupils are equal, round, and reactive to light.  Cardiovascular:     Rate and Rhythm: Normal rate and regular rhythm.     Pulses:  Normal pulses.          Dorsalis pedis pulses are 2+ on the right side and 2+ on the left side.       Posterior tibial pulses are 2+ on the right side and 2+ on the left side.  Pulmonary:     Effort: Pulmonary effort is normal. No respiratory distress.  Abdominal:     General: There is no distension.     Palpations: Abdomen is soft.  Musculoskeletal:        General: Normal range of motion.     Cervical back: Normal range of motion and neck supple.       Feet:     Comments: Mild tenderness distal aspect right great toe.  Able to flex and extend without difficulty.  No bony tenderness to foot, ankle, tib-fib.  Wiggles toes without difficulty.  Skin:    General: Skin is warm and dry.     Capillary Refill: Capillary refill takes less than 2 seconds.     Comments: No edema, erythema, warmth, fluctuance, induration.  No nailbed injury.  Neurological:     General: No focal deficit present.  Mental Status: He is alert and oriented to person, place, and time.     Sensory: Sensation is intact.     Motor: Motor function is intact.     Gait: Gait is intact.     Comments: intact sensation, ambulatory, equal strength bilaterally    ED Results / Procedures / Treatments   Labs (all labs ordered are listed, but only abnormal results are displayed) Labs Reviewed - No data to display  EKG None  Radiology DG Toe Great Right  Result Date: 06/11/2021 CLINICAL DATA:  Big toe injury after kicking a ball EXAM: RIGHT GREAT TOE COMPARISON:  None. FINDINGS: There is no evidence of fracture or dislocation. 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 Soft tissues are unremarkable. IMPRESSION: No acute findings. 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 Electronically Signed   By: Duanne Guess D.O.   On: 06/11/2021 12:21    Procedures Procedures    Medications Ordered in ED Medications - No data to display  ED Course/ Medical Decision Making/ A&P    51 year old here for  evaluation of right great toe pain after subsequently kicking a ball yesterday without close toed shoes.  Patient neurovascularly intact, ambulatory. He has full range of motion.  Does have some tenderness to his distal right great toe however no obvious injury on exam.  He has no obvious nailbed injury.  No erythema, warmth to suggest infectious process.  Afebrile.  No breaks in skin, to require tetanus, suturing or cleansing.  No pain to foot, ankle, tib-fib.  Nontender to proximal metatarsal to suggest gout, inflammatory process  Imaging personally viewed and interpreted: No fracture, dislocation.  Does show arthritis  Discussed results with patient.  Suspect sprain, strain, soft tissue contusion.  Discussed RICE for symptomatic management, buddy tape for comfort, follow-up with PCP.  Patient agreeable.  The patient has been appropriately medically screened and/or stabilized in the ED. I have low suspicion for any other emergent medical condition which would require further screening, evaluation or treatment in the ED or require inpatient management.  Patient is hemodynamically stable and in no acute distress.  Patient able to ambulate in department prior to ED.  Evaluation does not show acute pathology that would require ongoing or additional emergent interventions while in the emergency department or further inpatient treatment.  I have discussed the diagnosis with the patient and answered all questions.  Pain is been managed while in the emergency department and patient has no further complaints prior to discharge.  Patient is comfortable with plan discussed in room and is stable for discharge at this time.  I have discussed strict return precautions for returning to the emergency department.  Patient was encouraged to follow-up with PCP/specialist refer to at discharge.                           Medical Decision Making Amount and/or Complexity of Data Reviewed Radiology: ordered and independent  interpretation performed. Decision-making details documented in ED Course.  Risk OTC drugs. Diagnosis or treatment significantly limited by social determinants of health. Risk Details: Do not feel patient needs additional labs, imaging, hospitalization at this time  Risk: No PCP, established ortho care          Final Clinical Impression(s) / ED Diagnoses Final diagnoses:  Injury of toe on right foot, initial encounter    Rx / DC Orders ED Discharge Orders     None  Peri Kreft A, PA-C 06/11/21 1238    Virgina Norfolk, DO 06/11/21 1357

## 2021-06-11 NOTE — ED Notes (Signed)
Patient verbalizes understanding of discharge instructions. Opportunity for questioning and answers were provided. Patient discharged from ED.  °

## 2021-06-11 NOTE — ED Triage Notes (Signed)
Yesterday injured right big toe kicking ball with saddles on.  Previously broke big toe couple years ago. Toe swollen

## 2022-05-22 IMAGING — DX DG TOE GREAT 2+V*R*
3 series · 3 of 3 positions shown · non-contrast
Comparison: None.

CLINICAL DATA: Big toe injury after kicking a ball

EXAM:
RIGHT GREAT TOE

[toe ap]
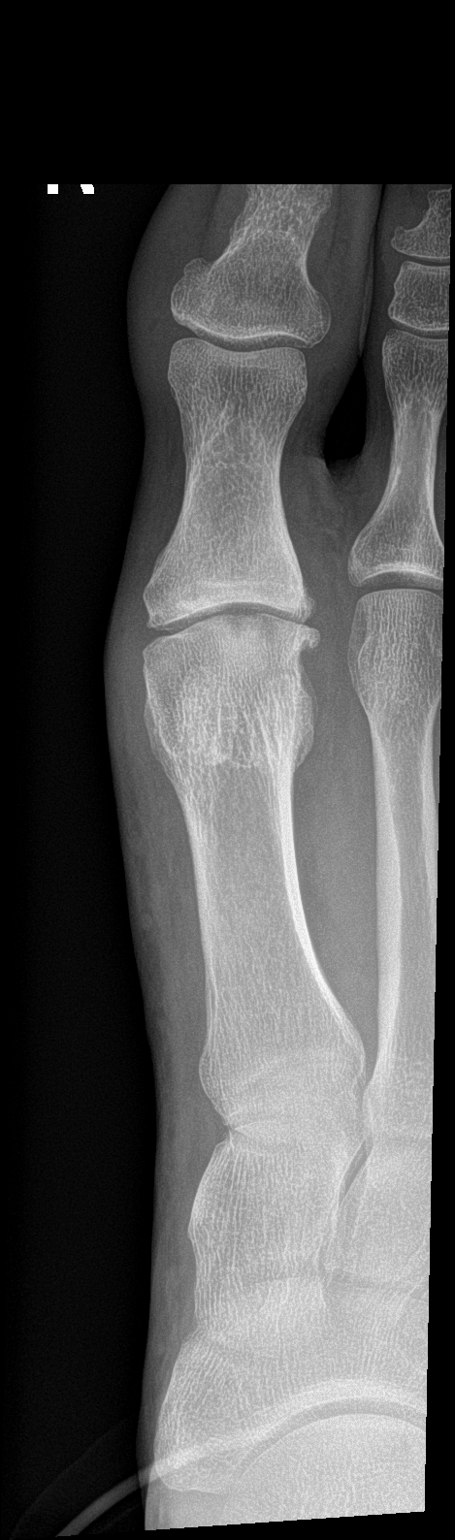

[toe obl]
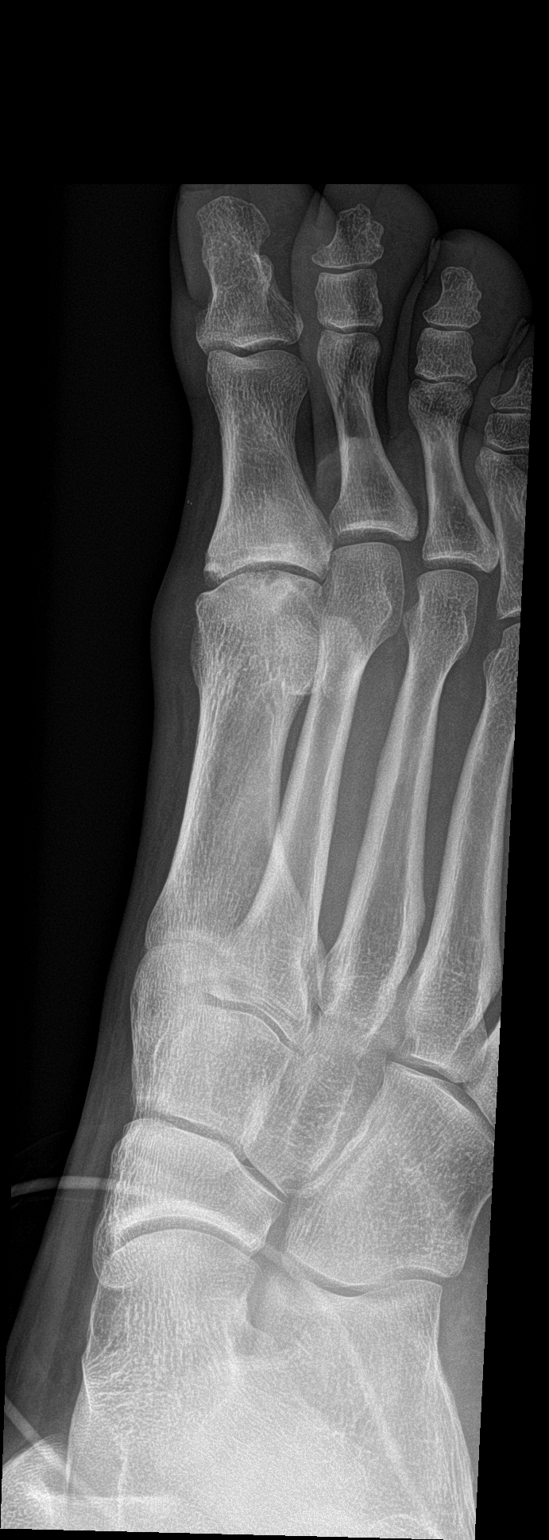

[toe lat]
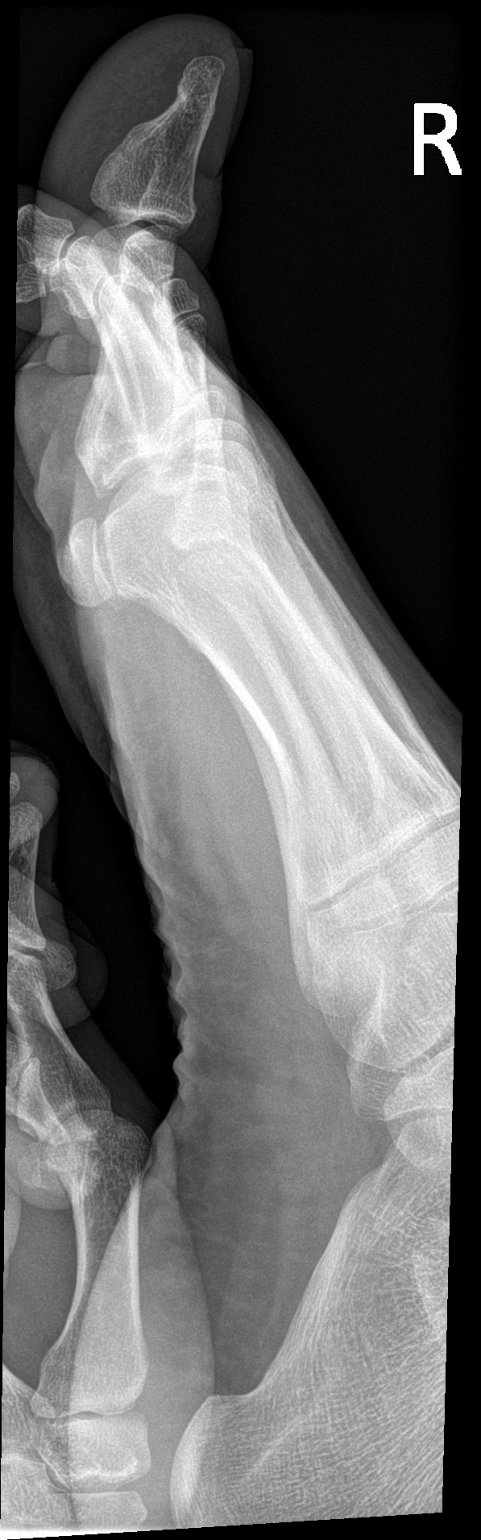

[3 of 3 positions shown; findings below may reference images not displayed]

FINDINGS: There is no evidence of fracture or dislocation. Mild-moderate
osteoarthritis of the first MTP joint. Soft tissues are
unremarkable.
IMPRESSION: No acute findings. Mild-moderate osteoarthritis of the first MTP
joint.

## 2023-08-05 ENCOUNTER — Encounter: Admitting: Urology
